# Patient Record
Sex: Female | Born: 1955 | Race: White | Hispanic: No | Smoking: Former smoker
Health system: Southern US, Community
[De-identification: ages and names within clinical notes are randomized; demographics above are authoritative.]

## PROBLEM LIST (undated history)

## (undated) DIAGNOSIS — K219 Gastro-esophageal reflux disease without esophagitis: Secondary | ICD-10-CM

## (undated) DIAGNOSIS — M7138 Other bursal cyst, other site: Secondary | ICD-10-CM

## (undated) DIAGNOSIS — G4700 Insomnia, unspecified: Secondary | ICD-10-CM

## (undated) DIAGNOSIS — A199 Miliary tuberculosis, unspecified: Secondary | ICD-10-CM

## (undated) DIAGNOSIS — F3289 Other specified depressive episodes: Secondary | ICD-10-CM

## (undated) DIAGNOSIS — E7800 Pure hypercholesterolemia, unspecified: Secondary | ICD-10-CM

## (undated) DIAGNOSIS — F419 Anxiety disorder, unspecified: Secondary | ICD-10-CM

## (undated) DIAGNOSIS — F5104 Psychophysiologic insomnia: Secondary | ICD-10-CM

## (undated) DIAGNOSIS — N393 Stress incontinence (female) (male): Secondary | ICD-10-CM

## (undated) DIAGNOSIS — M722 Plantar fascial fibromatosis: Secondary | ICD-10-CM

## (undated) DIAGNOSIS — L752 Apocrine miliaria: Secondary | ICD-10-CM

## (undated) DIAGNOSIS — R4589 Other symptoms and signs involving emotional state: Secondary | ICD-10-CM

## (undated) DIAGNOSIS — M419 Scoliosis, unspecified: Secondary | ICD-10-CM

## (undated) DIAGNOSIS — F329 Major depressive disorder, single episode, unspecified: Secondary | ICD-10-CM

## (undated) HISTORY — DX: Scoliosis, unspecified: M41.9

## (undated) HISTORY — DX: Psychophysiologic insomnia: F51.04

## (undated) HISTORY — DX: Apocrine miliaria: L75.2

## (undated) HISTORY — DX: Anxiety disorder, unspecified: F41.9

## (undated) HISTORY — DX: Other symptoms and signs involving emotional state: R45.89

## (undated) HISTORY — DX: Plantar fascial fibromatosis: M72.2

## (undated) HISTORY — DX: Gastro-esophageal reflux disease without esophagitis: K21.9

## (undated) HISTORY — DX: Stress incontinence (female) (male): N39.3

## (undated) HISTORY — DX: Pure hypercholesterolemia, unspecified: E78.00

## (undated) HISTORY — DX: Other specified depressive episodes: F32.89

## (undated) HISTORY — DX: Miliary tuberculosis, unspecified: A19.9

## (undated) HISTORY — DX: Major depressive disorder, single episode, unspecified: F32.9

## (undated) HISTORY — DX: Insomnia, unspecified: G47.00

## (undated) HISTORY — DX: Other bursal cyst, other site: M71.38

---
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
## 1989-04-18 HISTORY — PX: BIOPSY BREAST: PRO8

## 1997-09-09 ENCOUNTER — Ambulatory Visit (HOSPITAL_COMMUNITY): Admission: RE | Admit: 1997-09-09 | Discharge: 1997-09-09 | Payer: Self-pay | Admitting: *Deleted

## 1998-03-05 ENCOUNTER — Other Ambulatory Visit: Admission: RE | Admit: 1998-03-05 | Discharge: 1998-03-05 | Payer: Self-pay | Admitting: *Deleted

## 1998-08-20 ENCOUNTER — Encounter: Payer: Self-pay | Admitting: *Deleted

## 1998-08-20 ENCOUNTER — Ambulatory Visit (HOSPITAL_COMMUNITY): Admission: RE | Admit: 1998-08-20 | Discharge: 1998-08-20 | Payer: Self-pay | Admitting: *Deleted

## 1999-04-19 DIAGNOSIS — A199 Miliary tuberculosis, unspecified: Secondary | ICD-10-CM

## 1999-04-19 HISTORY — DX: Miliary tuberculosis, unspecified: A19.9

## 2000-02-01 ENCOUNTER — Other Ambulatory Visit: Admission: RE | Admit: 2000-02-01 | Discharge: 2000-02-01 | Payer: Self-pay | Admitting: *Deleted

## 2002-05-15 ENCOUNTER — Other Ambulatory Visit: Admission: RE | Admit: 2002-05-15 | Discharge: 2002-05-15 | Payer: Self-pay | Admitting: Obstetrics & Gynecology

## 2003-05-21 ENCOUNTER — Other Ambulatory Visit: Admission: RE | Admit: 2003-05-21 | Discharge: 2003-05-21 | Payer: Self-pay | Admitting: Obstetrics & Gynecology

## 2004-06-21 ENCOUNTER — Other Ambulatory Visit: Admission: RE | Admit: 2004-06-21 | Discharge: 2004-06-21 | Payer: Self-pay | Admitting: Obstetrics & Gynecology

## 2007-04-25 ENCOUNTER — Encounter: Admission: RE | Admit: 2007-04-25 | Discharge: 2007-07-24 | Payer: Self-pay | Admitting: Specialist

## 2007-07-31 ENCOUNTER — Encounter: Admission: RE | Admit: 2007-07-31 | Discharge: 2007-09-18 | Payer: Self-pay | Admitting: *Deleted

## 2008-01-17 ENCOUNTER — Encounter (INDEPENDENT_AMBULATORY_CARE_PROVIDER_SITE_OTHER): Payer: Self-pay | Admitting: Specialist

## 2008-01-17 ENCOUNTER — Inpatient Hospital Stay (HOSPITAL_COMMUNITY): Admission: RE | Admit: 2008-01-17 | Discharge: 2008-01-18 | Payer: Self-pay | Admitting: Specialist

## 2008-01-17 HISTORY — PX: BACK SURGERY: SHX140

## 2010-08-31 NOTE — Op Note (Signed)
NAME:  Kelly Hopkins, Kelly Hopkins NO.:  [id number]   MEDICAL RECORD NO.:  [id number]          PATIENT TYPE:  OIB   LOCATION:  1535                         FACILITY:  [facility]   PHYSICIAN:  Jene Every, M.D.    DATE OF BIRTH:  [birth year]   DATE OF PROCEDURE:  01/17/2008  DATE OF DISCHARGE:                               OPERATIVE REPORT   PREOPERATIVE DIAGNOSIS:  Spinal stenosis, synovial cyst, lateral recess  stenosis L5-S1.   POSTOPERATIVE DIAGNOSIS:  Spinal stenosis, synovial cyst, lateral recess  stenosis L5-S1.   PROCEDURE:  1. Lateral recess decompression L5-S1.  2. Excision of synovial cyst, hypertrophic facet foraminotomy of S1.   ANESTHESIA:  General.   SURGEON:  Jene Every, M.D.   ASSISTANT:  Roma Schanz, P.A.   INDICATIONS:  A 55 year old with refractory right buttock and thigh pain  and severe facet arthropathy at 4-5 and at 5-1 and a transitional  segment below that.  She had a synovial cyst off to the right impinging  upon the S1 nerve root.  Temporary relief from a selective block.  The  back pain though was we felt not to the point where we would feel a two  level fusion would be appropriate.  The significant majority of her pain  was in her buttock and leg with positive straight leg raise and had some  EHL weakness.  With the temporary relief of the mass effect by the cyst  decompression at L5-S1 was indicated.  Risks and benefits discussed  including bleeding, infection, damage to neurovascular structures, CSF  leakage, epidural fibrosis, adjacent disease, need for fusion in the  future, persistent back pain, anesthetic complications, etc.   PROCEDURE IN DETAIL:  With the patient in supine position after  induction of adequate general anesthesia 1 gram of Kefzol was placed,  prone on the [place] frame.  All bony points well-padded.  Lumbar region  prepped and draped in the usual sterile fashion.  The patient was noted  to be swayback.  Gauge  22 spinal needle was utilized to localize the 5-1  interspace confirmed with x-ray.  The patient had moderate obesity.  Incision was made through the skin, subcutaneous tissue was dissected.  Electrocautery utilized to achieve hemostasis.  Dorsolumbar fascia  identified __________ skin incision and paraspinous muscle elevated from  the lamina of 5 and 1.  McCullough retractor was placed.  Penfield 4 in  the interlaminar space confirmed by x-ray at 5-1.  A very small  interlaminar space was noted with a hypertrophic facet with small  osteophyte cysts emanating posteriorly.  This was excised.  A  hemilaminotomy at the caudad edge of 5 and cephalad edge of S1 was  performed with 2 mm Kerrison.  A hypertrophic ligamentum flavum was  noted and was excised as well.  Neural elements well protected.  A mass  which appeared to be multifactorial bony cyst with ligamentum flavum was  noted.  Cystic structure was excised with a 2 mm Kerrison.  We  decompressed from the medial border of the pedicle and the S1  foraminotomy was performed.  The disk  was without evidence of  herniation.  Undercut the 5 lamina as well with a confirmatory  radiograph with a hockey stick up into the foramen of 4 and cephalad was  confirmed by x-ray.  Following decompression she had good excursion of  the hockey stick cephalad and caudad with the foramen of 5 and S1  beneath the thecal sac.  There was good restoration of the nerve root.  No residual compression, no CSF leakage or active bleeding.  The wound  was copiously irrigated with antibiotic irrigation.  Bone wax placed on  the cancellous surface.  Again, full inspection revealed no further  compression upon the sac or the root.  McCullough retractor was removed.  Paraspinous muscles inspected.  No evidence of active bleeding, was  irrigated.  Dorsolumbar fascia reapproximated with #1 Vicryl interrupted  figure-of-eight sutures.  Subcutaneous tissue reapproximated with  2-0  Vicryl simple sutures.  Skin was reapproximated with 4-0 subcuticular  Prolene.  Wound reinforced with Steri-Strips.  Sterile dressing applied.  The patient was placed supine on the hospital bed, extubated without  difficulty and transported to the recovery room in satisfactory  condition.  The patient tolerated the procedure well with no  complications.      Jene Every, M.D.  Electronically Signed     JB/MEDQ  D:  01/17/2008  T:  01/18/2008  Job:  [id number]

## 2011-01-17 LAB — COMPREHENSIVE METABOLIC PANEL
BUN: 8
Calcium: 9.8
GFR calc non Af Amer: >60
Glucose, Bld: 88
Total Protein: 7.3

## 2011-01-17 LAB — URINALYSIS, ROUTINE W REFLEX MICROSCOPIC
Hgb urine dipstick: NEGATIVE
Ketones, ur: NEGATIVE
Protein, ur: NEGATIVE
Urobilinogen, UA: 0.2

## 2011-01-17 LAB — PROTIME-INR
INR: 1
Prothrombin Time: 12.8

## 2011-01-17 LAB — CBC
HCT: 42.8
Hemoglobin: 14.2
MCHC: 33.3
MCV: 93.6
RDW: 13.5

## 2011-01-17 LAB — PREGNANCY, URINE: Preg Test, Ur: NEGATIVE

## 2012-08-09 ENCOUNTER — Other Ambulatory Visit: Payer: Self-pay

## 2012-08-09 DIAGNOSIS — N393 Stress incontinence (female) (male): Secondary | ICD-10-CM | POA: Insufficient documentation

## 2012-08-09 DIAGNOSIS — F4321 Adjustment disorder with depressed mood: Secondary | ICD-10-CM | POA: Insufficient documentation

## 2012-08-09 DIAGNOSIS — F5105 Insomnia due to other mental disorder: Secondary | ICD-10-CM

## 2012-08-09 DIAGNOSIS — F4323 Adjustment disorder with mixed anxiety and depressed mood: Secondary | ICD-10-CM | POA: Insufficient documentation

## 2012-08-09 DIAGNOSIS — F3289 Other specified depressive episodes: Secondary | ICD-10-CM | POA: Insufficient documentation

## 2012-08-09 DIAGNOSIS — M4120 Other idiopathic scoliosis, site unspecified: Secondary | ICD-10-CM | POA: Insufficient documentation

## 2012-08-09 DIAGNOSIS — K219 Gastro-esophageal reflux disease without esophagitis: Secondary | ICD-10-CM | POA: Insufficient documentation

## 2012-08-09 DIAGNOSIS — M722 Plantar fascial fibromatosis: Secondary | ICD-10-CM | POA: Insufficient documentation

## 2012-10-23 ENCOUNTER — Other Ambulatory Visit: Payer: Self-pay | Admitting: Obstetrics & Gynecology

## 2017-05-08 DIAGNOSIS — Z1231 Encounter for screening mammogram for malignant neoplasm of breast: Secondary | ICD-10-CM | POA: Diagnosis not present

## 2017-05-25 ENCOUNTER — Telehealth: Payer: Self-pay | Admitting: Neurology

## 2017-05-25 ENCOUNTER — Encounter: Payer: Self-pay | Admitting: Neurology

## 2017-05-25 ENCOUNTER — Ambulatory Visit: Payer: BLUE CROSS/BLUE SHIELD | Admitting: Neurology

## 2017-05-25 VITALS — BP 173/113 | HR 86 | Ht 61.5 in | Wt 174.0 lb

## 2017-05-25 DIAGNOSIS — G250 Essential tremor: Secondary | ICD-10-CM | POA: Diagnosis not present

## 2017-05-25 DIAGNOSIS — Z820 Family history of epilepsy and other diseases of the nervous system: Secondary | ICD-10-CM | POA: Diagnosis not present

## 2017-05-25 NOTE — Telephone Encounter (Signed)
I had explained to the patient that if we cannot get an approval for her MRI we will monitor her clinically for now. She can follow-up in 6 months, and we can revisit the need for an MRI. Please call for a follow-up appointment which we did not schedule yet.

## 2017-05-25 NOTE — Patient Instructions (Addendum)
Your head tremor is rather mild.   There is no other abnormality, in particular, no signs of parkinsonism.   We will do a brain scan, called MRI and call you with the test results. We will have to schedule you for this on a separate date. This test requires authorization from your insurance, and we will take care of the insurance process.   Please remember, that any kind of tremor may be exacerbated by anxiety, anger, nervousness, excitement, dehydration, sleep deprivation, by caffeine, and low blood sugar values or blood sugar fluctuations. Some medications, especially some antidepressants and lithium can cause or exacerbate tremors. Tremors may temporarily calm down or subside with the use of a benzodiazepine such as Valium or related medications and with alcohol. Be aware, however, that drinking alcohol is not an approved or appropriate treatment for tremor control and long-term use of benzodiazepines such as Valium, lorazepam, alprazolam, or clonazepam can cause habit formation, physical and psychological addiction. There are very few medications that symptomatically help with tremor reduction, none are without potential side effects.   Please try to exercise regularly, consider seeing Dr. Shelle IronBeane again for your back pain.   I can see you back as needed, so long as her brain MRI shows age-appropriate findings.
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
## 2017-05-25 NOTE — Telephone Encounter (Signed)
I was unable to get the MRI approved on my level. I called [employer] and spoke to one of their nurse's and she was unable to get it approved on her level. The phone number for the peer to peer is [phone]. The member ID is [id number] & DOB is 1955/08/04. The case does close on Monday 05/29/17.

## 2017-05-26 NOTE — Telephone Encounter (Signed)
I called pt, I advised her that her insurance will not approve the MRI. Dr. Frances FurbishAthar recommends monitoring her clinically for now and following up in 6 months. Pt is agreeable to this and an appt was made for 11/21/17 at 8:30am. Pt verbalized understanding of the recommendations and of appt date and time.

## 2017-06-07 DIAGNOSIS — M545 Low back pain: Secondary | ICD-10-CM | POA: Diagnosis not present

## 2017-06-07 DIAGNOSIS — M9903 Segmental and somatic dysfunction of lumbar region: Secondary | ICD-10-CM | POA: Diagnosis not present

## 2017-06-07 DIAGNOSIS — M2560 Stiffness of unspecified joint, not elsewhere classified: Secondary | ICD-10-CM | POA: Diagnosis not present

## 2017-06-07 DIAGNOSIS — R293 Abnormal posture: Secondary | ICD-10-CM | POA: Diagnosis not present

## 2017-06-08 DIAGNOSIS — M79605 Pain in left leg: Secondary | ICD-10-CM | POA: Diagnosis not present

## 2017-06-08 DIAGNOSIS — M79651 Pain in right thigh: Secondary | ICD-10-CM | POA: Diagnosis not present

## 2017-06-08 DIAGNOSIS — M79604 Pain in right leg: Secondary | ICD-10-CM | POA: Diagnosis not present

## 2017-06-08 DIAGNOSIS — M79652 Pain in left thigh: Secondary | ICD-10-CM | POA: Diagnosis not present

## 2017-06-09 DIAGNOSIS — R293 Abnormal posture: Secondary | ICD-10-CM | POA: Diagnosis not present

## 2017-06-09 DIAGNOSIS — M2560 Stiffness of unspecified joint, not elsewhere classified: Secondary | ICD-10-CM | POA: Diagnosis not present

## 2017-06-09 DIAGNOSIS — M545 Low back pain: Secondary | ICD-10-CM | POA: Diagnosis not present

## 2017-06-09 DIAGNOSIS — M9903 Segmental and somatic dysfunction of lumbar region: Secondary | ICD-10-CM | POA: Diagnosis not present

## 2017-06-14 DIAGNOSIS — M545 Low back pain: Secondary | ICD-10-CM | POA: Diagnosis not present

## 2017-06-14 DIAGNOSIS — R293 Abnormal posture: Secondary | ICD-10-CM | POA: Diagnosis not present

## 2017-06-14 DIAGNOSIS — M9903 Segmental and somatic dysfunction of lumbar region: Secondary | ICD-10-CM | POA: Diagnosis not present

## 2017-06-14 DIAGNOSIS — M2560 Stiffness of unspecified joint, not elsewhere classified: Secondary | ICD-10-CM | POA: Diagnosis not present

## 2017-06-19 DIAGNOSIS — M545 Low back pain: Secondary | ICD-10-CM | POA: Diagnosis not present

## 2017-06-19 DIAGNOSIS — M2560 Stiffness of unspecified joint, not elsewhere classified: Secondary | ICD-10-CM | POA: Diagnosis not present

## 2017-06-19 DIAGNOSIS — R293 Abnormal posture: Secondary | ICD-10-CM | POA: Diagnosis not present

## 2017-06-19 DIAGNOSIS — M9903 Segmental and somatic dysfunction of lumbar region: Secondary | ICD-10-CM | POA: Diagnosis not present

## 2017-06-21 DIAGNOSIS — M2560 Stiffness of unspecified joint, not elsewhere classified: Secondary | ICD-10-CM | POA: Diagnosis not present

## 2017-06-21 DIAGNOSIS — M545 Low back pain: Secondary | ICD-10-CM | POA: Diagnosis not present

## 2017-06-21 DIAGNOSIS — R293 Abnormal posture: Secondary | ICD-10-CM | POA: Diagnosis not present

## 2017-06-21 DIAGNOSIS — M9903 Segmental and somatic dysfunction of lumbar region: Secondary | ICD-10-CM | POA: Diagnosis not present

## 2017-06-23 DIAGNOSIS — R293 Abnormal posture: Secondary | ICD-10-CM | POA: Diagnosis not present

## 2017-06-23 DIAGNOSIS — M9903 Segmental and somatic dysfunction of lumbar region: Secondary | ICD-10-CM | POA: Diagnosis not present

## 2017-06-23 DIAGNOSIS — M545 Low back pain: Secondary | ICD-10-CM | POA: Diagnosis not present

## 2017-06-23 DIAGNOSIS — M2560 Stiffness of unspecified joint, not elsewhere classified: Secondary | ICD-10-CM | POA: Diagnosis not present

## 2017-06-27 DIAGNOSIS — M545 Low back pain: Secondary | ICD-10-CM | POA: Diagnosis not present

## 2017-06-27 DIAGNOSIS — M9903 Segmental and somatic dysfunction of lumbar region: Secondary | ICD-10-CM | POA: Diagnosis not present

## 2017-06-27 DIAGNOSIS — M2560 Stiffness of unspecified joint, not elsewhere classified: Secondary | ICD-10-CM | POA: Diagnosis not present

## 2017-06-27 DIAGNOSIS — R293 Abnormal posture: Secondary | ICD-10-CM | POA: Diagnosis not present

## 2017-06-28 DIAGNOSIS — R293 Abnormal posture: Secondary | ICD-10-CM | POA: Diagnosis not present

## 2017-06-28 DIAGNOSIS — M9903 Segmental and somatic dysfunction of lumbar region: Secondary | ICD-10-CM | POA: Diagnosis not present

## 2017-06-28 DIAGNOSIS — M545 Low back pain: Secondary | ICD-10-CM | POA: Diagnosis not present

## 2017-06-28 DIAGNOSIS — M2560 Stiffness of unspecified joint, not elsewhere classified: Secondary | ICD-10-CM | POA: Diagnosis not present

## 2017-06-30 DIAGNOSIS — M2560 Stiffness of unspecified joint, not elsewhere classified: Secondary | ICD-10-CM | POA: Diagnosis not present

## 2017-06-30 DIAGNOSIS — M545 Low back pain: Secondary | ICD-10-CM | POA: Diagnosis not present

## 2017-06-30 DIAGNOSIS — M9903 Segmental and somatic dysfunction of lumbar region: Secondary | ICD-10-CM | POA: Diagnosis not present

## 2017-06-30 DIAGNOSIS — R293 Abnormal posture: Secondary | ICD-10-CM | POA: Diagnosis not present

## 2017-07-03 DIAGNOSIS — R293 Abnormal posture: Secondary | ICD-10-CM | POA: Diagnosis not present

## 2017-07-03 DIAGNOSIS — M2560 Stiffness of unspecified joint, not elsewhere classified: Secondary | ICD-10-CM | POA: Diagnosis not present

## 2017-07-03 DIAGNOSIS — M9903 Segmental and somatic dysfunction of lumbar region: Secondary | ICD-10-CM | POA: Diagnosis not present

## 2017-07-03 DIAGNOSIS — M545 Low back pain: Secondary | ICD-10-CM | POA: Diagnosis not present

## 2017-07-05 DIAGNOSIS — M2560 Stiffness of unspecified joint, not elsewhere classified: Secondary | ICD-10-CM | POA: Diagnosis not present

## 2017-07-05 DIAGNOSIS — M9903 Segmental and somatic dysfunction of lumbar region: Secondary | ICD-10-CM | POA: Diagnosis not present

## 2017-07-05 DIAGNOSIS — M545 Low back pain: Secondary | ICD-10-CM | POA: Diagnosis not present

## 2017-07-05 DIAGNOSIS — R293 Abnormal posture: Secondary | ICD-10-CM | POA: Diagnosis not present

## 2017-07-07 DIAGNOSIS — M9903 Segmental and somatic dysfunction of lumbar region: Secondary | ICD-10-CM | POA: Diagnosis not present

## 2017-07-07 DIAGNOSIS — M545 Low back pain: Secondary | ICD-10-CM | POA: Diagnosis not present

## 2017-07-07 DIAGNOSIS — R293 Abnormal posture: Secondary | ICD-10-CM | POA: Diagnosis not present

## 2017-07-07 DIAGNOSIS — M2560 Stiffness of unspecified joint, not elsewhere classified: Secondary | ICD-10-CM | POA: Diagnosis not present

## 2017-07-10 DIAGNOSIS — M9903 Segmental and somatic dysfunction of lumbar region: Secondary | ICD-10-CM | POA: Diagnosis not present

## 2017-07-10 DIAGNOSIS — M2560 Stiffness of unspecified joint, not elsewhere classified: Secondary | ICD-10-CM | POA: Diagnosis not present

## 2017-07-10 DIAGNOSIS — M545 Low back pain: Secondary | ICD-10-CM | POA: Diagnosis not present

## 2017-07-10 DIAGNOSIS — R293 Abnormal posture: Secondary | ICD-10-CM | POA: Diagnosis not present

## 2017-08-08 DIAGNOSIS — H10021 Other mucopurulent conjunctivitis, right eye: Secondary | ICD-10-CM | POA: Diagnosis not present

## 2017-08-08 DIAGNOSIS — J209 Acute bronchitis, unspecified: Secondary | ICD-10-CM | POA: Diagnosis not present

## 2017-08-19 DIAGNOSIS — J208 Acute bronchitis due to other specified organisms: Secondary | ICD-10-CM | POA: Diagnosis not present

## 2017-08-19 DIAGNOSIS — R062 Wheezing: Secondary | ICD-10-CM | POA: Diagnosis not present

## 2017-08-19 DIAGNOSIS — H109 Unspecified conjunctivitis: Secondary | ICD-10-CM | POA: Diagnosis not present

## 2017-08-19 DIAGNOSIS — R05 Cough: Secondary | ICD-10-CM | POA: Diagnosis not present

## 2017-09-12 DIAGNOSIS — M79605 Pain in left leg: Secondary | ICD-10-CM | POA: Diagnosis not present

## 2017-09-12 DIAGNOSIS — M79604 Pain in right leg: Secondary | ICD-10-CM | POA: Diagnosis not present

## 2017-10-04 DIAGNOSIS — H2513 Age-related nuclear cataract, bilateral: Secondary | ICD-10-CM | POA: Diagnosis not present

## 2017-10-05 DIAGNOSIS — M25512 Pain in left shoulder: Secondary | ICD-10-CM | POA: Diagnosis not present

## 2017-10-05 DIAGNOSIS — M7541 Impingement syndrome of right shoulder: Secondary | ICD-10-CM | POA: Diagnosis not present

## 2017-10-05 DIAGNOSIS — M75112 Incomplete rotator cuff tear or rupture of left shoulder, not specified as traumatic: Secondary | ICD-10-CM | POA: Diagnosis not present

## 2017-10-05 DIAGNOSIS — M25511 Pain in right shoulder: Secondary | ICD-10-CM | POA: Diagnosis not present

## 2017-10-12 DIAGNOSIS — M25512 Pain in left shoulder: Secondary | ICD-10-CM | POA: Diagnosis not present

## 2017-10-18 DIAGNOSIS — M75112 Incomplete rotator cuff tear or rupture of left shoulder, not specified as traumatic: Secondary | ICD-10-CM | POA: Diagnosis not present

## 2017-10-18 DIAGNOSIS — M7541 Impingement syndrome of right shoulder: Secondary | ICD-10-CM | POA: Diagnosis not present

## 2017-10-18 DIAGNOSIS — M25512 Pain in left shoulder: Secondary | ICD-10-CM | POA: Diagnosis not present

## 2017-11-07 DIAGNOSIS — M25512 Pain in left shoulder: Secondary | ICD-10-CM | POA: Diagnosis not present

## 2017-11-09 DIAGNOSIS — M25512 Pain in left shoulder: Secondary | ICD-10-CM | POA: Diagnosis not present

## 2017-11-21 ENCOUNTER — Encounter: Payer: Self-pay | Admitting: Neurology

## 2017-11-21 ENCOUNTER — Ambulatory Visit: Payer: BLUE CROSS/BLUE SHIELD | Admitting: Neurology

## 2017-11-21 VITALS — BP 140/94 | HR 88 | Ht 61.5 in | Wt 166.5 lb

## 2017-11-21 DIAGNOSIS — G250 Essential tremor: Secondary | ICD-10-CM | POA: Diagnosis not present

## 2017-11-21 DIAGNOSIS — R51 Headache: Secondary | ICD-10-CM | POA: Diagnosis not present

## 2017-11-21 DIAGNOSIS — H538 Other visual disturbances: Secondary | ICD-10-CM | POA: Diagnosis not present

## 2017-11-21 DIAGNOSIS — R519 Headache, unspecified: Secondary | ICD-10-CM

## 2017-11-21 NOTE — Patient Instructions (Addendum)
Your exam is stable, thankfully.  I will request a brain MRI again. We will do a blood test today for TSH and CMP.  So long as your MRI is age-appropriate, I can see you back as needed.  Please make sure you discuss with your eye doctor your blurry vision.
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
## 2017-11-22 ENCOUNTER — Telehealth: Payer: Self-pay | Admitting: Neurology

## 2017-11-22 LAB — COMPREHENSIVE METABOLIC PANEL
A/G RATIO: 1.8 (ref 1.2–2.2)
ALK PHOS: 68 IU/L (ref 39–117)
ALT: 24 IU/L (ref 0–32)
AST: 20 IU/L (ref 0–40)
Albumin: 4.5 g/dL (ref 3.6–4.8)
BUN/Creatinine Ratio: 24 (ref 12–28)
BUN: 17 mg/dL (ref 8–27)
Bilirubin Total: 0.5 mg/dL (ref 0.0–1.2)
CALCIUM: 9.9 mg/dL (ref 8.7–10.3)
CO2: 25 mmol/L (ref 20–29)
Chloride: 103 mmol/L (ref 96–106)
Creatinine, Ser: 0.71 mg/dL (ref 0.57–1.00)
GFR calc Af Amer: 106 mL/min/{1.73_m2} (ref >59)
GFR, EST NON AFRICAN AMERICAN: 92 mL/min/{1.73_m2} (ref >59)
GLOBULIN, TOTAL: 2.5 g/dL (ref 1.5–4.5)
Glucose: 87 mg/dL (ref 65–99)
Potassium: 4.9 mmol/L (ref 3.5–5.2)
SODIUM: 143 mmol/L (ref 134–144)
Total Protein: 7 g/dL (ref 6.0–8.5)

## 2017-11-22 LAB — TSH: TSH: 1.89 u[IU]/mL (ref 0.450–4.500)

## 2017-11-22 NOTE — Telephone Encounter (Signed)
MR Brain w/wo contrast Dr. Alinda SierrasAthar [employer] Auth: [id number] (exp. 11/21/17 to 12/20/17). [facility]

## 2017-11-23 DIAGNOSIS — M25512 Pain in left shoulder: Secondary | ICD-10-CM | POA: Diagnosis not present

## 2017-11-27 DIAGNOSIS — M25512 Pain in left shoulder: Secondary | ICD-10-CM | POA: Diagnosis not present

## 2017-11-28 ENCOUNTER — Ambulatory Visit (INDEPENDENT_AMBULATORY_CARE_PROVIDER_SITE_OTHER): Payer: BLUE CROSS/BLUE SHIELD

## 2017-11-28 DIAGNOSIS — H538 Other visual disturbances: Secondary | ICD-10-CM

## 2017-11-28 DIAGNOSIS — R51 Headache: Secondary | ICD-10-CM

## 2017-11-28 DIAGNOSIS — R519 Headache, unspecified: Secondary | ICD-10-CM

## 2017-11-28 DIAGNOSIS — G250 Essential tremor: Secondary | ICD-10-CM

## 2017-11-28 MED ORDER — GADOPENTETATE DIMEGLUMINE 469.01 MG/ML IV SOLN
15.0000 mL | Freq: Once | INTRAVENOUS | Status: AC | PRN
  Administered 2017-11-28: 15 mL via INTRAVENOUS

## 2017-11-29 DIAGNOSIS — M25512 Pain in left shoulder: Secondary | ICD-10-CM | POA: Diagnosis not present

## 2017-12-01 ENCOUNTER — Telehealth: Payer: Self-pay

## 2017-12-01 NOTE — Telephone Encounter (Signed)
I spoke with Dr. Frances FurbishAthar. Pt's TSH and CMP were also normal.  I called pt, and explained pt's MRI and lab results. Pt is asking for a copy of her recent labs and MRI to be mailed to pt. I verified that her address is correct. Will ask our medical records dept to handle this. Pt verbalized understanding of results. Pt had no questions at this time but was encouraged to call back if questions arise.

## 2017-12-01 NOTE — Telephone Encounter (Signed)
-----   Message from [name], MD sent at 12/01/2017 11:10 AM EDT ----- Brain MRI w/wo contrast showed normal for age findings. Please advise pt.  Kelly Harveysa

## 2017-12-01 NOTE — Progress Notes (Signed)
Brain MRI w/wo contrast showed normal for age findings. Please advise pt.  Kelly Hopkins

## 2017-12-04 NOTE — Telephone Encounter (Signed)
I mailed pt recent labs and MRI report on 12/04/17 to pt home address.

## 2017-12-05 DIAGNOSIS — M25512 Pain in left shoulder: Secondary | ICD-10-CM | POA: Diagnosis not present

## 2017-12-12 DIAGNOSIS — M25512 Pain in left shoulder: Secondary | ICD-10-CM | POA: Diagnosis not present

## 2017-12-14 DIAGNOSIS — M25512 Pain in left shoulder: Secondary | ICD-10-CM | POA: Diagnosis not present

## 2017-12-19 DIAGNOSIS — M25512 Pain in left shoulder: Secondary | ICD-10-CM | POA: Diagnosis not present

## 2017-12-19 DIAGNOSIS — M7541 Impingement syndrome of right shoulder: Secondary | ICD-10-CM | POA: Diagnosis not present

## 2017-12-19 DIAGNOSIS — M75112 Incomplete rotator cuff tear or rupture of left shoulder, not specified as traumatic: Secondary | ICD-10-CM | POA: Diagnosis not present

## 2017-12-21 DIAGNOSIS — M25512 Pain in left shoulder: Secondary | ICD-10-CM | POA: Diagnosis not present

## 2018-01-26 DIAGNOSIS — Z23 Encounter for immunization: Secondary | ICD-10-CM | POA: Diagnosis not present

## 2018-04-09 DIAGNOSIS — Z1211 Encounter for screening for malignant neoplasm of colon: Secondary | ICD-10-CM | POA: Diagnosis not present

## 2018-04-09 DIAGNOSIS — Z01419 Encounter for gynecological examination (general) (routine) without abnormal findings: Secondary | ICD-10-CM | POA: Diagnosis not present

## 2018-04-09 DIAGNOSIS — Z124 Encounter for screening for malignant neoplasm of cervix: Secondary | ICD-10-CM | POA: Diagnosis not present

## 2018-04-09 DIAGNOSIS — Z6829 Body mass index (BMI) 29.0-29.9, adult: Secondary | ICD-10-CM | POA: Diagnosis not present

## 2018-04-09 DIAGNOSIS — R350 Frequency of micturition: Secondary | ICD-10-CM | POA: Diagnosis not present

## 2018-05-16 DIAGNOSIS — Z1231 Encounter for screening mammogram for malignant neoplasm of breast: Secondary | ICD-10-CM | POA: Diagnosis not present

## 2018-06-04 DIAGNOSIS — H1013 Acute atopic conjunctivitis, bilateral: Secondary | ICD-10-CM | POA: Diagnosis not present

## 2018-07-03 DIAGNOSIS — M7541 Impingement syndrome of right shoulder: Secondary | ICD-10-CM | POA: Diagnosis not present

## 2018-07-03 DIAGNOSIS — M25512 Pain in left shoulder: Secondary | ICD-10-CM | POA: Diagnosis not present

## 2018-07-03 DIAGNOSIS — M75112 Incomplete rotator cuff tear or rupture of left shoulder, not specified as traumatic: Secondary | ICD-10-CM | POA: Diagnosis not present

## 2018-10-08 DIAGNOSIS — H5213 Myopia, bilateral: Secondary | ICD-10-CM | POA: Diagnosis not present

## 2018-10-08 DIAGNOSIS — H10413 Chronic giant papillary conjunctivitis, bilateral: Secondary | ICD-10-CM | POA: Diagnosis not present

## 2018-10-08 DIAGNOSIS — H25043 Posterior subcapsular polar age-related cataract, bilateral: Secondary | ICD-10-CM | POA: Diagnosis not present

## 2018-10-12 DIAGNOSIS — Z20828 Contact with and (suspected) exposure to other viral communicable diseases: Secondary | ICD-10-CM | POA: Diagnosis not present

## 2019-03-04 DIAGNOSIS — R110 Nausea: Secondary | ICD-10-CM | POA: Diagnosis not present

## 2019-03-04 DIAGNOSIS — R1032 Left lower quadrant pain: Secondary | ICD-10-CM | POA: Diagnosis not present

## 2019-03-04 DIAGNOSIS — R109 Unspecified abdominal pain: Secondary | ICD-10-CM | POA: Diagnosis not present

## 2019-03-09 DIAGNOSIS — Z20828 Contact with and (suspected) exposure to other viral communicable diseases: Secondary | ICD-10-CM | POA: Diagnosis not present

## 2019-05-13 DIAGNOSIS — Z6828 Body mass index (BMI) 28.0-28.9, adult: Secondary | ICD-10-CM | POA: Diagnosis not present

## 2019-05-13 DIAGNOSIS — Z0000 Encounter for general adult medical examination without abnormal findings: Secondary | ICD-10-CM | POA: Diagnosis not present

## 2019-05-13 DIAGNOSIS — Z01419 Encounter for gynecological examination (general) (routine) without abnormal findings: Secondary | ICD-10-CM | POA: Diagnosis not present

## 2019-05-20 DIAGNOSIS — Z1211 Encounter for screening for malignant neoplasm of colon: Secondary | ICD-10-CM | POA: Diagnosis not present

## 2019-05-20 DIAGNOSIS — M85852 Other specified disorders of bone density and structure, left thigh: Secondary | ICD-10-CM | POA: Diagnosis not present

## 2019-05-20 DIAGNOSIS — Z1231 Encounter for screening mammogram for malignant neoplasm of breast: Secondary | ICD-10-CM | POA: Diagnosis not present

## 2019-10-10 DIAGNOSIS — H25813 Combined forms of age-related cataract, bilateral: Secondary | ICD-10-CM | POA: Diagnosis not present

## 2019-10-10 DIAGNOSIS — H5213 Myopia, bilateral: Secondary | ICD-10-CM | POA: Diagnosis not present

## 2019-11-14 DIAGNOSIS — H00012 Hordeolum externum right lower eyelid: Secondary | ICD-10-CM | POA: Diagnosis not present

## 2020-05-20 DIAGNOSIS — Z1231 Encounter for screening mammogram for malignant neoplasm of breast: Secondary | ICD-10-CM | POA: Diagnosis not present

## 2020-06-04 DIAGNOSIS — R5383 Other fatigue: Secondary | ICD-10-CM | POA: Diagnosis not present

## 2020-06-04 DIAGNOSIS — E559 Vitamin D deficiency, unspecified: Secondary | ICD-10-CM | POA: Diagnosis not present

## 2020-06-04 DIAGNOSIS — R7303 Prediabetes: Secondary | ICD-10-CM | POA: Diagnosis not present

## 2020-06-04 DIAGNOSIS — E7800 Pure hypercholesterolemia, unspecified: Secondary | ICD-10-CM | POA: Diagnosis not present

## 2020-09-09 DIAGNOSIS — E7800 Pure hypercholesterolemia, unspecified: Secondary | ICD-10-CM | POA: Diagnosis not present

## 2020-10-13 DIAGNOSIS — H25813 Combined forms of age-related cataract, bilateral: Secondary | ICD-10-CM | POA: Diagnosis not present

## 2020-10-13 DIAGNOSIS — H35373 Puckering of macula, bilateral: Secondary | ICD-10-CM | POA: Diagnosis not present

## 2020-10-13 DIAGNOSIS — H5213 Myopia, bilateral: Secondary | ICD-10-CM | POA: Diagnosis not present

## 2020-11-20 DIAGNOSIS — U071 COVID-19: Secondary | ICD-10-CM | POA: Diagnosis not present

## 2020-11-30 ENCOUNTER — Encounter: Payer: Self-pay | Admitting: Internal Medicine

## 2020-11-30 ENCOUNTER — Ambulatory Visit (INDEPENDENT_AMBULATORY_CARE_PROVIDER_SITE_OTHER): Payer: Medicare Other | Admitting: Internal Medicine

## 2020-11-30 ENCOUNTER — Other Ambulatory Visit: Payer: Self-pay

## 2020-11-30 VITALS — BP 143/86 | HR 75 | Ht 61.0 in | Wt 172.6 lb

## 2020-11-30 DIAGNOSIS — R42 Dizziness and giddiness: Secondary | ICD-10-CM

## 2020-11-30 DIAGNOSIS — R011 Cardiac murmur, unspecified: Secondary | ICD-10-CM

## 2020-11-30 DIAGNOSIS — E785 Hyperlipidemia, unspecified: Secondary | ICD-10-CM | POA: Diagnosis not present

## 2020-11-30 NOTE — Progress Notes (Signed)
OFFICE CONSULT NOTE  Chief Complaint:  Dizziness, statin intolerance  Primary Care Physician: Patient, No Pcp Per (Inactive)  HPI:  Kelly Hopkins is a 65 y.o. female who is being seen today for the evaluation of dizziness, statin intolerance at the request of Annamaria Helling, MD. This is a pleasant 65 year old female kindly referred by Dr. Aldona Bar for evaluation and management of dizziness and statin intolerance.  She has been followed by neurology for dizziness but recently has had some issues with imbalance, hearing her heartbeat in her neck, and suffering from some leg cramps.  She has been on and off of statins, in the past has tried Lipitor, Zocor and more recently placed on Crestor for elevated cholesterol.  Her on treatment cholesterol in May was total 176, triglycerides 112, HDL 71 and LDL 85.  Unfortunately she still feels like she is having cramping on this medication which is rosuvastatin 20 mg daily.  Off treatment I would expect her cholesterol to be about 50% higher.  She also takes low-dose aspirin but has few other cardiovascular risk factors including no history of hypertension, diabetes or other risk factors other than age.  She does report her father had had an MI that he died from in his 71s.  She eats a varied diet but has had recent weight gain.  PMHx:  Past Medical History:  Diagnosis Date   Acid reflux    Anxiety    Anxiety, mild    Chronic insomnia    Emotional depression    Female stress incontinence    Fox-Fordyce disease    Hypercholesteremia    Insomnia    Menopausal depression    Plantar fasciitis, left    Scoliosis deformity of spine    Synovial cyst of lumbar facet joint    L5-S1   Tuberculosis disease, miliary 2001   Took meds x 9 mos     FAMHx:  Family History  Problem Relation Age of Onset   Asthma Mother    COPD Mother        Deceased   Diabetes Father    Heart attack Father 83       Deceased   Breast cancer Paternal Aunt     SOCHx:    reports that she quit smoking about 14 years ago. Her smoking use included cigarettes. She has a 36.00 pack-year smoking history. She has never used smokeless tobacco. She reports current alcohol use of about 2.0 standard drinks per week. She reports that she does not use drugs.  ALLERGIES:  No Known Allergies  ROS: Pertinent items noted in HPI and remainder of comprehensive ROS otherwise negative.  HOME MEDS: Current Outpatient Medications on File Prior to Visit  Medication Sig Dispense Refill   Acetaminophen (TYLENOL ARTHRITIS EXT RELIEF PO)      ALPRAZolam (XANAX) 0.25 MG tablet alprazolam 0.25 mg tablet  ONE TABLET AS NEEDED FOR ANXIETY   NOT TO EXCEED 1 /24 HOURS     aspirin EC 81 MG tablet Take 81 mg by mouth daily.     ibuprofen (ADVIL) 600 MG tablet ibuprofen 600 mg tablet  TAKE 1 TABLET BY MOUTH EVERY 6 TO 8 HOURS AS NEEDED FOR PAIN     pantoprazole (PROTONIX) 40 MG tablet Take 40 mg by mouth daily. (Patient not taking: Reported on 11/30/2020)     pantoprazole (PROTONIX) 40 MG tablet pantoprazole 40 mg tablet,delayed release  TAKE 1 TABLET BY MOUTH EVERY DAY IN THE MORNING     rosuvastatin (  CRESTOR) 20 MG tablet rosuvastatin 20 mg tablet  TAKE 1 TABLET BY MOUTH EVERYDAY AT BEDTIME     sertraline (ZOLOFT) 100 MG tablet sertraline 100 mg tablet  TAKE 1 TABLET BY MOUTH EVERY DAY     zolpidem (AMBIEN) 10 MG tablet zolpidem 10 mg tablet  TAKE 1/2 TO 1 TABLET AT BEDTIME FOR SLEEP AS NEEDED     No current facility-administered medications on file prior to visit.    LABS/IMAGING: No results found for this or any previous visit (from the past 48 hour(s)). No results found.  LIPID PANEL: No results found for: CHOL, TRIG, HDL, CHOLHDL, VLDL, LDLCALC, LDLDIRECT  WEIGHTS: Wt Readings from Last 3 Encounters:  11/30/20 172 lb 9.6 oz (78.3 kg)  11/21/17 166 lb 8 oz (75.5 kg)  05/25/17 174 lb (78.9 kg)    VITALS: BP (!) 143/86   Pulse 75   Ht 5\' 1"  (1.549 m)   Wt 172 lb 9.6 oz  (78.3 kg)   SpO2 98%   BMI 32.61 kg/m   EXAM: General appearance: alert and no distress Neck: no carotid bruit, no JVD, and thyroid not enlarged, symmetric, no tenderness/mass/nodules Lungs: clear to auscultation bilaterally Heart: regular rate and rhythm, S1, S2 normal, and diastolic murmur: mid diastolic 2/6, blowing at 2nd right intercostal space Abdomen: soft, non-tender; bowel sounds normal; no masses,  no organomegaly Extremities: extremities normal, atraumatic, no cyanosis or edema Pulses: 2+ and symmetric Skin: Skin color, texture, turgor normal. No rashes or lesions Neurologic: Grossly normal Psych: Pleasant  EKG: Normal sinus rhythm at 75- personally reviewed  ASSESSMENT: Dizziness Statin intolerance-myalgias/cramps Mixed dyslipidemia Murmur  PLAN: 1.   Ms. Grilli has had some dizziness and had been previously diagnosed with an axial tremor.  She notes she can hear her heartbeat sometimes at night.  She also gets some intermittent palpitations.  She has had leg cramping and soreness on statins which seems to have recurred after switching to rosuvastatin recently although her cholesterol is very well controlled.  She is on low-dose aspirin which may not be necessary for primary prevention since she has few risk factors.  I would recommend a coronary calcium score to better risk stratify her.  If it is lower 0 would recommend discontinuing aspirin.  We will get an echo to evaluate her murmur as well.  She is never had 1 but reports having had a murmur for most of her life.  Finally she is having some dizziness.  Is not clear whether this would be related to her tremor.  Could be a combination of causes.  We will plan a 2-week statin holiday to see if her symptoms improve.  Ultimately if there is some evidence of premature ASCVD, we may be able to consider a PCSK9 inhibitor however at this point she would not necessarily qualify just on statin intolerance alone.  Thanks again for  the kind referral.  Daphine Deutscher, MD, [facility]  [place]  [facility]  Medical Director of the [facility]  [facility] Diplomate of the American Board of Clinical Lipidology Attending Cardiologist  Direct Dial: [phone]  Fax: [phone]  Website:  [email or web address] Esme Freund 11/30/2020, 8:44 AM

## 2020-11-30 NOTE — Patient Instructions (Signed)
Medication Instructions:  Please HOLD your cholesterol medication rosuvastatin for 2-3 weeks and then let us know how you are doing off the medication.  *If you need a refill on your cardiac medications before your next appointment, please call your pharmacy*   Testing/Procedures:  Dr. Rennis Golden has requested that you have an echocardiogram. Echocardiography is a painless test that uses sound waves to create images of your heart. It provides your doctor with information about the size and shape of your heart and how well your heart's chambers and valves are working. This procedure takes approximately one hour. There are no restrictions for this procedure. This test is done at [address].   Dr. Rennis Golden has ordered a CT coronary calcium score. This test is done at [address]. This is $99 out of pocket.   Coronary CalciumScan A coronary calcium scan is an imaging test used to look for deposits of calcium and other fatty materials (plaques) in the inner lining of the blood vessels of the heart (coronary arteries). These deposits of calcium and plaques can partly clog and narrow the coronary arteries without producing any symptoms or warning signs. This puts a person at risk for a heart attack. This test can detect these deposits before symptoms develop. Tell a health care provider about: Any allergies you have. All medicines you are taking, including vitamins, herbs, eye drops, creams, and over-the-counter medicines. Any problems you or family members have had with anesthetic medicines. Any blood disorders you have. Any surgeries you have had. Any medical conditions you have. Whether you are pregnant or may be pregnant. What are the risks? Generally, this is a safe procedure. However, problems may occur, including: Harm to a pregnant woman and her unborn baby. This test involves the use of radiation. Radiation exposure can be dangerous to a pregnant woman and  her unborn baby. If you are pregnant, you generally should not have this procedure done. Slight increase in the risk of cancer. This is because of the radiation involved in the test. What happens before the procedure? No preparation is needed for this procedure. What happens during the procedure? You will undress and remove any jewelry around your neck or chest. You will put on a hospital gown. Sticky electrodes will be placed on your chest. The electrodes will be connected to an electrocardiogram (ECG) machine to record a tracing of the electrical activity of your heart. A CT scanner will take pictures of your heart. During this time, you will be asked to lie still and hold your breath for 2-3 seconds while a picture of your heart is being taken. The procedure may vary among health care providers and hospitals. What happens after the procedure? You can get dressed. You can return to your normal activities. It is up to you to get the results of your test. Ask your health care provider, or the department that is doing the test, when your results will be ready. Summary A coronary calcium scan is an imaging test used to look for deposits of calcium and other fatty materials (plaques) in the inner lining of the blood vessels of the heart (coronary arteries). Generally, this is a safe procedure. Tell your health care provider if you are pregnant or may be pregnant. No preparation is needed for this procedure. A CT scanner will take pictures of your heart. You can return to your normal activities after the scan is done. This information is not intended to replace  advice given to you by your health care provider. Make sure you discuss any questions you have with your health care provider. Document Released: 10/01/2007 Document Revised: 02/22/2016 Document Reviewed: 02/22/2016 Elsevier Interactive Patient Education  2017 [employer].    Follow-Up: At [facility], you and your health needs are  our priority.  As part of our continuing mission to provide you with exceptional heart care, we have created designated Provider Care Teams.  These Care Teams include your primary Cardiologist (physician) and Advanced Practice Providers (APPs -  Physician Assistants and Nurse Practitioners) who all work together to provide you with the care you need, when you need it.  We recommend signing up for the patient portal called "MyChart".  Sign up information is provided on this After Visit Summary.  MyChart is used to connect with patients for Virtual Visits (Telemedicine).  Patients are able to view lab/test results, encounter notes, upcoming appointments, etc.  Non-urgent messages can be sent to your provider as well.   To learn more about what you can do with MyChart, go to [email or web address].    Your next appointment:   4-6 week(s)  The format for your next appointment:   In Person  Provider:   You may see Dr. Rennis Golden or one of the following Advanced Practice Providers on your designated Care Team:   Azalee Course, PA-C Micah Flesher, [place] or  Judy Pimple, [place]  OK to double book Dr. Rennis Golden on a DOD day if needed   Other Instructions

## 2020-12-14 ENCOUNTER — Other Ambulatory Visit: Payer: Self-pay

## 2020-12-14 ENCOUNTER — Ambulatory Visit (INDEPENDENT_AMBULATORY_CARE_PROVIDER_SITE_OTHER)
Admission: RE | Admit: 2020-12-14 | Discharge: 2020-12-14 | Disposition: A | Discharge status: Home / Self Care | Payer: Self-pay | Source: Ambulatory Visit | Attending: Internal Medicine | Admitting: Internal Medicine

## 2020-12-14 DIAGNOSIS — E785 Hyperlipidemia, unspecified: Secondary | ICD-10-CM

## 2020-12-17 ENCOUNTER — Other Ambulatory Visit: Payer: Self-pay

## 2020-12-17 ENCOUNTER — Ambulatory Visit (HOSPITAL_COMMUNITY): Payer: Medicare Other | Attending: Cardiology

## 2020-12-17 DIAGNOSIS — R42 Dizziness and giddiness: Secondary | ICD-10-CM | POA: Insufficient documentation

## 2020-12-17 DIAGNOSIS — R011 Cardiac murmur, unspecified: Secondary | ICD-10-CM | POA: Diagnosis not present

## 2020-12-17 LAB — ECHOCARDIOGRAM COMPLETE
Area-P 1/2: 3.17 cm2
S' Lateral: 2.8 cm

## 2020-12-24 ENCOUNTER — Encounter: Payer: Self-pay | Admitting: Internal Medicine

## 2020-12-24 ENCOUNTER — Other Ambulatory Visit: Payer: Self-pay

## 2020-12-24 ENCOUNTER — Ambulatory Visit (HOSPITAL_COMMUNITY)
Admission: RE | Admit: 2020-12-24 | Discharge: 2020-12-24 | Disposition: A | Discharge status: Home / Self Care | Payer: Medicare Other | Source: Ambulatory Visit | Attending: Internal Medicine | Admitting: Internal Medicine

## 2020-12-24 ENCOUNTER — Ambulatory Visit: Payer: Medicare Other | Admitting: Internal Medicine

## 2020-12-24 VITALS — BP 120/90 | HR 86 | Resp 20 | Ht 61.0 in | Wt 170.6 lb

## 2020-12-24 DIAGNOSIS — I700 Atherosclerosis of aorta: Secondary | ICD-10-CM

## 2020-12-24 DIAGNOSIS — R011 Cardiac murmur, unspecified: Secondary | ICD-10-CM

## 2020-12-24 DIAGNOSIS — M79662 Pain in left lower leg: Secondary | ICD-10-CM | POA: Diagnosis not present

## 2020-12-24 DIAGNOSIS — E785 Hyperlipidemia, unspecified: Secondary | ICD-10-CM | POA: Diagnosis not present

## 2020-12-24 MED ORDER — ROSUVASTATIN CALCIUM 10 MG PO TABS: 10.0000 mg | ORAL_TABLET | Freq: Every day | ORAL | 3 refills | Status: DC

## 2020-12-24 NOTE — Progress Notes (Signed)
OFFICE CONSULT NOTE  Chief Complaint:  Follow-up dizziness, statin intolerance  Primary Care Physician: Patient, No Pcp Per (Inactive)  HPI:  Kelly Hopkins is a 65 y.o. female who is being seen today for the evaluation of dizziness, statin intolerance at the request of No ref. provider found. This is a pleasant 65 year old female kindly referred by Dr. Aldona Bar for evaluation and management of dizziness and statin intolerance.  She has been followed by neurology for dizziness but recently has had some issues with imbalance, hearing her heartbeat in her neck, and suffering from some leg cramps.  She has been on and off of statins, in the past has tried Lipitor, Zocor and more recently placed on Crestor for elevated cholesterol.  Her on treatment cholesterol in May was total 176, triglycerides 112, HDL 71 and LDL 85.  Unfortunately she still feels like she is having cramping on this medication which is rosuvastatin 20 mg daily.  Off treatment I would expect her cholesterol to be about 50% higher.  She also takes low-dose aspirin but has few other cardiovascular risk factors including no history of hypertension, diabetes or other risk factors other than age.  She does report her father had had an MI that he died from in his 24s.  She eats a varied diet but has had recent weight gain.  12/24/2020  Kelly Hopkins is seen today in follow-up.  Overall she is doing fairly well.  She has been experiencing some left leg pain particularly tightness and discomfort in her left calf particularly when walking.  She felt like this was a statin side effect however after taking a statin holiday the symptoms did not necessarily improve.  I advised her that is not typical to see a statin side effect focal to one muscle group.  She does have some varicose veins and it could be related to that.  She is also concerned about the possibility of DVT.  She has been on low-dose aspirin however which would lower that risk.  She had a  calcium score which is 0 although there was some aortic atherosclerosis.  I would target her LDL to less than 100.  She had previously been on 20 of rosuvastatin and I would recommend we decrease that to 10 mg daily to try to strike a balance between risk and benefit.  Echo showed some aortic sclerosis with normal systolic function and mild diastolic dysfunction.  Nothing of super concern.   PMHx:  Past Medical History:  Diagnosis Date   Acid reflux    Anxiety    Anxiety, mild    Chronic insomnia    Emotional depression    Female stress incontinence    Fox-Fordyce disease    Hypercholesteremia    Insomnia    Menopausal depression    Plantar fasciitis, left    Scoliosis deformity of spine    Synovial cyst of lumbar facet joint    L5-S1   Tuberculosis disease, miliary 2001   Took meds x 9 mos     FAMHx:  Family History  Problem Relation Age of Onset   Asthma Mother    COPD Mother        Deceased   Diabetes Father    Heart attack Father 43       Deceased   Breast cancer Paternal Aunt     SOCHx:   reports that she quit smoking about 14 years ago. Her smoking use included cigarettes. She has a 36.00 pack-year smoking history. She  has never used smokeless tobacco. She reports current alcohol use of about 2.0 standard drinks per week. She reports that she does not use drugs.  ALLERGIES:  Allergies  Allergen Reactions   Atorvastatin     Myalgias, Cramps   Rosuvastatin     Muscle cramps   Zocor [Simvastatin]     Myalgias, Cramps    ROS: Pertinent items noted in HPI and remainder of comprehensive ROS otherwise negative.  HOME MEDS: Current Outpatient Medications on File Prior to Visit  Medication Sig Dispense Refill   Acetaminophen (TYLENOL ARTHRITIS EXT RELIEF PO)      ALPRAZolam (XANAX) 0.25 MG tablet alprazolam 0.25 mg tablet  ONE TABLET AS NEEDED FOR ANXIETY   NOT TO EXCEED 1 /24 HOURS     pantoprazole (PROTONIX) 40 MG tablet Take 40 mg by mouth daily.      sertraline (ZOLOFT) 100 MG tablet sertraline 100 mg tablet  TAKE 1 TABLET BY MOUTH EVERY DAY     zolpidem (AMBIEN) 10 MG tablet zolpidem 10 mg tablet  TAKE 1/2 TO 1 TABLET AT BEDTIME FOR SLEEP AS NEEDED     ibuprofen (IBU-200) 200 MG tablet Take 200 mg by mouth as needed.     influenza vac split quadrivalent PF (AFLURIA QUADRIVALENT) 0.5 ML injection Afluria Qd 2020-21 (36 mos up)(PF)60 mcg (15 mcg x4)/0.5 mL IM syringe  PHARMACY ADMINISTERED     No current facility-administered medications on file prior to visit.    LABS/IMAGING: No results found for this or any previous visit (from the past 48 hour(s)). No results found.  LIPID PANEL: No results found for: CHOL, TRIG, HDL, CHOLHDL, VLDL, LDLCALC, LDLDIRECT  WEIGHTS: Wt Readings from Last 3 Encounters:  12/24/20 170 lb 9.6 oz (77.4 kg)  11/30/20 172 lb 9.6 oz (78.3 kg)  11/21/17 166 lb 8 oz (75.5 kg)    VITALS: BP 120/90 (BP Location: Left Arm, Patient Position: Sitting, Cuff Size: Normal)   Pulse 86   Resp 20   Ht 5\' 1"  (1.549 m)   Wt 170 lb 9.6 oz (77.4 kg)   SpO2 96%   BMI 32.23 kg/m   EXAM: General appearance: alert and no distress Extremities: edema 1+ LLE, TTP over left calf, no erythema Pulses: 2+ and symmetric Skin: Skin color, texture, turgor normal. 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   [name] did not have significant improvement after statin holiday.  I advise restarting her Crestor but will decrease her dose to 10 mg daily to try to reduce potential side effects.  This should still keep her LDL below 100.  She is describing some left calf pain.  She is concerned about DVT which I think is unlikely based on physical exam however we will go ahead and get venous Dopplers.  If  negative she can discontinue aspirin because she had no coronary calcium either.  Echo showed some mild aortic sclerosis but no evidence of stenosis.  We will plan repeat lipids in about 3 months and I will contact her with those results.  Otherwise follow-up with me annually or sooner as necessary  Daphine Deutscher, MD, [facility], FACP  [place]  [facility]  Medical Director of the Advanced Lipid Disorders &   Cardiovascular Risk Reduction Clinic Diplomate of the American Board  of Clinical Lipidology Attending Cardiologist  Direct Dial: [phone]  Fax: [phone]  Website:  [email or web address] [email or web address] 12/24/2020, 8:44 AM

## 2020-12-24 NOTE — Patient Instructions (Signed)
Medication Instructions:  START crestor 10mg  daily   *If you need a refill on your cardiac medications before your next appointment, please call your pharmacy*   Lab Work: FASTING lab work in about 3 months to check cholesterol   If you have labs (blood work) drawn today and your tests are completely normal, you will receive your results only by: MyChart Message (if you have MyChart) OR A paper copy in the mail If you have any lab test that is abnormal or we need to change your treatment, we will call you to review the results.   Testing/Procedures:    Follow-Up: At [facility], you and your health needs are our priority.  As part of our continuing mission to provide you with exceptional heart care, we have created designated Provider Care Teams.  These Care Teams include your primary Cardiologist (physician) and Advanced Practice Providers (APPs -  Physician Assistants and Nurse Practitioners) who all work together to provide you with the care you need, when you need it.  We recommend signing up for the patient portal called "MyChart".  Sign up information is provided on this After Visit Summary.  MyChart is used to connect with patients for Virtual Visits (Telemedicine).  Patients are able to view lab/test results, encounter notes, upcoming appointments, etc.  Non-urgent messages can be sent to your provider as well.   To learn more about what you can do with MyChart, go to [facility].    Your next appointment:   12 month(s)  The format for your next appointment:   In Person  Provider:   [email or web address] [place], MD   Other Instructions

## 2020-12-25 ENCOUNTER — Other Ambulatory Visit: Payer: Self-pay | Admitting: *Deleted

## 2021-04-06 DIAGNOSIS — E785 Hyperlipidemia, unspecified: Secondary | ICD-10-CM | POA: Diagnosis not present

## 2021-04-07 LAB — LIPID PANEL
Chol/HDL Ratio: 3 ratio (ref 0.0–4.4)
Cholesterol, Total: 202 mg/dL — ABNORMAL HIGH (ref 100–199)
HDL: 67 mg/dL (ref >39)
LDL Chol Calc (NIH): 109 mg/dL — ABNORMAL HIGH (ref 0–99)
Triglycerides: 152 mg/dL — ABNORMAL HIGH (ref 0–149)
VLDL Cholesterol Cal: 26 mg/dL (ref 5–40)

## 2021-04-09 ENCOUNTER — Encounter: Payer: Self-pay | Admitting: Internal Medicine

## 2021-04-09 DIAGNOSIS — E785 Hyperlipidemia, unspecified: Secondary | ICD-10-CM

## 2021-05-26 DIAGNOSIS — Z1231 Encounter for screening mammogram for malignant neoplasm of breast: Secondary | ICD-10-CM | POA: Diagnosis not present

## 2021-06-04 DIAGNOSIS — R928 Other abnormal and inconclusive findings on diagnostic imaging of breast: Secondary | ICD-10-CM | POA: Diagnosis not present

## 2021-06-07 DIAGNOSIS — E7800 Pure hypercholesterolemia, unspecified: Secondary | ICD-10-CM | POA: Diagnosis not present

## 2021-06-07 DIAGNOSIS — Z0000 Encounter for general adult medical examination without abnormal findings: Secondary | ICD-10-CM | POA: Diagnosis not present

## 2021-06-23 ENCOUNTER — Encounter: Payer: Self-pay | Admitting: Internal Medicine

## 2021-06-23 ENCOUNTER — Other Ambulatory Visit: Payer: Self-pay | Admitting: Internal Medicine

## 2021-06-24 MED ORDER — ROSUVASTATIN CALCIUM 10 MG PO TABS: 10.0000 mg | ORAL_TABLET | Freq: Every day | ORAL | 3 refills | Status: DC

## 2021-08-03 DIAGNOSIS — J069 Acute upper respiratory infection, unspecified: Secondary | ICD-10-CM | POA: Diagnosis not present

## 2021-10-14 DIAGNOSIS — H5213 Myopia, bilateral: Secondary | ICD-10-CM | POA: Diagnosis not present

## 2021-10-14 DIAGNOSIS — H2513 Age-related nuclear cataract, bilateral: Secondary | ICD-10-CM | POA: Diagnosis not present

## 2021-10-14 DIAGNOSIS — H04123 Dry eye syndrome of bilateral lacrimal glands: Secondary | ICD-10-CM | POA: Diagnosis not present

## 2022-03-26 ENCOUNTER — Other Ambulatory Visit: Payer: Self-pay | Admitting: Internal Medicine

## 2022-06-01 DIAGNOSIS — Z1231 Encounter for screening mammogram for malignant neoplasm of breast: Secondary | ICD-10-CM | POA: Diagnosis not present

## 2022-07-26 ENCOUNTER — Ambulatory Visit (HOSPITAL_BASED_OUTPATIENT_CLINIC_OR_DEPARTMENT_OTHER): Payer: Medicare Other | Admitting: Internal Medicine

## 2022-07-26 ENCOUNTER — Encounter (HOSPITAL_BASED_OUTPATIENT_CLINIC_OR_DEPARTMENT_OTHER): Payer: Self-pay | Admitting: Internal Medicine

## 2022-07-26 ENCOUNTER — Ambulatory Visit: Payer: Medicare Other | Attending: Internal Medicine

## 2022-07-26 VITALS — BP 128/78 | HR 77 | Ht 61.5 in | Wt 173.4 lb

## 2022-07-26 DIAGNOSIS — E785 Hyperlipidemia, unspecified: Secondary | ICD-10-CM

## 2022-07-26 DIAGNOSIS — I358 Other nonrheumatic aortic valve disorders: Secondary | ICD-10-CM | POA: Diagnosis not present

## 2022-07-26 DIAGNOSIS — R002 Palpitations: Secondary | ICD-10-CM

## 2022-07-26 DIAGNOSIS — I700 Atherosclerosis of aorta: Secondary | ICD-10-CM

## 2022-07-26 NOTE — Progress Notes (Unsigned)
Enrolled patient for a 14 day Zio XT  monitor to be mailed to patients home  °

## 2022-07-26 NOTE — Progress Notes (Addendum)
OFFICE CONSULT NOTE  Chief Complaint:  Follow-up  Primary Care Physician: Sigmund Hazel, MD  HPI:  Kelly Hopkins is a 67 y.o. female who is being seen today for the evaluation of dizziness, statin intolerance at the request of No ref. provider found. This is a pleasant 67 year old female kindly referred by Dr. Aldona Bar for evaluation and management of dizziness and statin intolerance.  She has been followed by neurology for dizziness but recently has had some issues with imbalance, hearing her heartbeat in her neck, and suffering from some leg cramps.  She has been on and off of statins, in the past has tried Lipitor, Zocor and more recently placed on Crestor for elevated cholesterol.  Her on treatment cholesterol in May was total 176, triglycerides 112, HDL 71 and LDL 85.  Unfortunately she still feels like she is having cramping on this medication which is rosuvastatin 20 mg daily.  Off treatment I would expect her cholesterol to be about 50% higher.  She also takes low-dose aspirin but has few other cardiovascular risk factors including no history of hypertension, diabetes or other risk factors other than age.  She does report her father had had an MI that he died from in his 24s.  She eats a varied diet but has had recent weight gain.  12/24/2020  Kelly Hopkins is seen today in follow-up.  Overall she is doing fairly well.  She has been experiencing some left leg pain particularly tightness and discomfort in her left calf particularly when walking.  She felt like this was a statin side effect however after taking a statin holiday the symptoms did not necessarily improve.  I advised her that is not typical to see a statin side effect focal to one muscle group.  She does have some varicose veins and it could be related to that.  She is also concerned about the possibility of DVT.  She has been on low-dose aspirin however which would lower that risk.  She had a calcium score which is 0 although there  was some aortic atherosclerosis.  I would target her LDL to less than 100.  She had previously been on 20 of rosuvastatin and I would recommend we decrease that to 10 mg daily to try to strike a balance between risk and benefit.  Echo showed some aortic sclerosis with normal systolic function and mild diastolic dysfunction.  Nothing of super concern.  07/26/2022  Kelly Hopkins returns today for follow-up.  Recently she has been having some palpitations.  She has noted it mostly at rest.  They are paroxysmal.  Nothing seems to provoke it or improve it.  She is also noted some elevated heart rate and also some slower than typical heart rates (in the 60s) on her Fitbit.  She denies any chest pain.  She had a negative calcium score in the past and an echo which showed some mild diastolic dysfunction, normal LVEF and aortic valve sclerosis.  Her cholesterol was assessed last year when LDL was at 99 which was at target.  She is tolerating 10 mg rosuvastatin daily without issues.  PMHx:  Past Medical History:  Diagnosis Date   Acid reflux    Anxiety    Anxiety, mild    Chronic insomnia    Emotional depression    Female stress incontinence    Fox-Fordyce disease    Hypercholesteremia    Insomnia    Menopausal depression    Plantar fasciitis, left    Scoliosis  deformity of spine    Synovial cyst of lumbar facet joint    L5-S1   Tuberculosis disease, miliary 2001   Took meds x 9 mos     FAMHx:  Family History  Problem Relation Age of Onset   Asthma Mother    COPD Mother        Deceased   Diabetes Father    Heart attack Father [id number]       Deceased   Breast cancer Paternal Aunt     SOCHx:   reports that she quit smoking about 15 years ago. Her smoking use included cigarettes. She has a 36.00 pack-year smoking history. She has never used smokeless tobacco. She reports current alcohol use of about 2.0 standard drinks of alcohol per week. She reports that she does not use drugs.  ALLERGIES:   Allergies  Allergen Reactions   Atorvastatin     Myalgias, Cramps   Rosuvastatin     Muscle cramps   Zocor [Simvastatin]     Myalgias, Cramps    ROS: Pertinent items noted in HPI and remainder of comprehensive ROS otherwise negative.  HOME MEDS: Current Outpatient Medications on File Prior to Visit  Medication Sig Dispense Refill   Acetaminophen (TYLENOL ARTHRITIS EXT RELIEF PO)      ALPRAZolam (XANAX) 0.25 MG tablet alprazolam 0.25 mg tablet  ONE TABLET AS NEEDED FOR ANXIETY   NOT TO EXCEED 1 /24 HOURS     ibuprofen (IBU-200) 200 MG tablet Take 200 mg by mouth as needed.     influenza vac split quadrivalent PF (AFLURIA QUADRIVALENT) 0.5 ML injection Afluria Qd 2020-21 (36 mos up)(PF)60 mcg (15 mcg x4)/0.5 mL IM syringe  PHARMACY ADMINISTERED     pantoprazole (PROTONIX) 40 MG tablet Take 40 mg by mouth daily.     rosuvastatin (CRESTOR) 10 MG tablet TAKE 1 TABLET BY MOUTH EVERY DAY 90 tablet 0   sertraline (ZOLOFT) 100 MG tablet sertraline 100 mg tablet  TAKE 1 TABLET BY MOUTH EVERY DAY     zolpidem (AMBIEN) 10 MG tablet zolpidem 10 mg tablet  TAKE 1/2 TO 1 TABLET AT BEDTIME FOR SLEEP AS NEEDED     No current facility-administered medications on file prior to visit.    LABS/IMAGING: No results found for this or any previous visit (from the past 48 hour(s)). No results found.  LIPID PANEL:    Component Value Date/Time   CHOL 202 (H) 04/06/2021 0914   TRIG 152 (H) 04/06/2021 0914   HDL 67 04/06/2021 0914   CHOLHDL 3.0 04/06/2021 0914   LDLCALC 109 (H) 04/06/2021 0914    WEIGHTS: Wt Readings from Last 3 Encounters:  07/26/22 173 lb 6.4 oz (78.7 kg)  12/24/20 170 lb 9.6 oz (77.4 kg)  11/30/20 172 lb 9.6 oz (78.3 kg)    VITALS: Ht 5' 1.5" (1.562 m)   Wt 173 lb 6.4 oz (78.7 kg)   BMI 32.23 kg/m   EXAM: General appearance: alert and no distress Neck: no carotid bruit, no JVD, and thyroid not enlarged, symmetric, no tenderness/mass/nodules Lungs: clear to  auscultation bilaterally Heart: regular rate and rhythm and systolic murmur: early systolic 2/6, crescendo at 2nd right intercostal space Abdomen: soft, non-tender; bowel sounds normal; no masses,  no organomegaly Extremities: extremities normal, atraumatic, no cyanosis or edema Pulses: 2+ and symmetric Skin: Skin color, texture, turgor normal. No rashes or lesions Neurologic: Grossly normal Psych: Pleasant  EKG: Normal sinus rhythm at 77-personally reviewed  ASSESSMENT: Palpitations Zero CAC score (11/2020) -  aortic atherosclerosis Aortic valve sclerosis, LVEF 60-65%, grade 1 DD (12/2020) Dizziness Statin intolerance-myalgias/cramps Mixed dyslipidemia, goal LDL <100  PLAN: 1.   Kelly Hopkins has recently been having some palpitations.  She is try to make dietary modifications and lifestyle changes but they persist.  Will place a 2-week monitor to see if we can pick up on the etiology of this.  I suspect either way they are probably low risk but want to rule out possibility of A-fib.  She does have an aortic valve sclerosis murmur that is not necessarily worse than when I listen 2 years ago.  I do not feel she needs a repeat echo.  She is due for repeat lipids which we will draw today.  She reported being fasting.  Medication adjustments as needed.  Plan follow-up with me annually or sooner as necessary.  Chrystie Nose, MD, [facility], FACP  [place]  [facility]  Medical Director of the Advanced Lipid Disorders &   Cardiovascular Risk Reduction Clinic Diplomate of the American Board of Clinical Lipidology Attending Cardiologist  Direct Dial: [phone]  Fax: [phone]  Website:  [email or web address] 07/26/2022, 9:35 AM

## 2022-07-26 NOTE — Patient Instructions (Signed)
Medication Instructions:  Your physician recommends that you continue on your current medications as directed. Please refer to the Current Medication list given to you today.  *If you need a refill on your cardiac medications before your next appointment, please call your pharmacy*   Lab Work: LIPID PANEL today   If you have labs (blood work) drawn today and your tests are completely normal, you will receive your results only by: MyChart Message (if you have MyChart) OR A paper copy in the mail If you have any lab test that is abnormal or we need to change your treatment, we will call you to review the results.   Testing/Procedures: Kelly Hopkins- Long Term Monitor Instructions  Your physician has requested you wear a ZIO patch monitor for 14 days.  This is a single patch monitor. [employer] supplies one patch monitor per enrollment. Additional stickers are not available. Please do not apply patch if you will be having a Nuclear Stress Test,  Echocardiogram, Cardiac CT, MRI, or Chest Xray during the period you would be wearing the  monitor. The patch cannot be worn during these tests. You cannot remove and re-apply the  ZIO XT patch monitor.  Your ZIO patch monitor will be mailed 3 day USPS to your address on file. It may take 3-5 days  to receive your monitor after you have been enrolled.  Once you have received your monitor, please review the enclosed instructions. Your monitor  has already been registered assigning a specific monitor serial # to you.  Billing and Patient Assistance Program Information  We have supplied [employer] with any of your insurance information on file for billing purposes. [employer] offers a sliding scale Patient Assistance Program for patients that do not have  insurance, or whose insurance does not completely cover the cost of the ZIO monitor.  You must apply for the Patient Assistance Program to qualify for this discounted rate.  To apply, please call [employer] at  [phone], select option 4, select option 2, ask to apply for  Patient Assistance Program. Kelly Hopkins will ask your household income, and how many people  are in your household. They will quote your out-of-pocket cost based on that information.  [employer] will also be able to set up a 70-month, interest-free payment plan if needed.  Applying the monitor   Shave hair from upper left chest.  Hold abrader disc by orange tab. Rub abrader in 40 strokes over the upper left chest as  indicated in your monitor instructions.  Clean area with 4 enclosed alcohol pads. Let dry.  Apply patch as indicated in monitor instructions. Patch will be placed under collarbone on left  side of chest with arrow pointing upward.  Rub patch adhesive wings for 2 minutes. Remove white label marked "1". Remove the white  label marked "2". Rub patch adhesive wings for 2 additional minutes.  While looking in a mirror, press and release button in center of patch. A small green light will  flash 3-4 times. This will be your only indicator that the monitor has been turned on.  Do not shower for the first 24 hours. You may shower after the first 24 hours.  Press the button if you feel a symptom. You will hear a small click. Record Date, Time and  Symptom in the Patient Logbook.  When you are ready to remove the patch, follow instructions on the last 2 pages of Patient  Logbook. Stick patch monitor onto the last page of Patient Logbook.  Place Patient Logbook in the blue and white box. Use locking tab on box and tape box closed  securely. The blue and white box has prepaid postage on it. Please place it in the mailbox as  soon as possible. Your physician should have your test results approximately 7 days after the  monitor has been mailed back to [facility].  Call [facility] Customer Care at [phone] if you have questions regarding  your ZIO XT patch monitor. Call them immediately if you see an orange light  blinking on your  monitor.  If your monitor falls off in less than 4 days, contact our Monitor department at [phone].  If your monitor becomes loose or falls off after 4 days call [employer] at [phone] for  suggestions on securing your monitor    Follow-Up: At [facility], you and your health needs are our priority.  As part of our continuing mission to provide you with exceptional heart care, we have created designated Provider Care Teams.  These Care Teams include your primary Cardiologist (physician) and Advanced Practice Providers (APPs -  Physician Assistants and Nurse Practitioners) who all work together to provide you with the care you need, when you need it.  We recommend signing up for the patient portal called "MyChart".  Sign up information is provided on this After Visit Summary.  MyChart is used to connect with patients for Virtual Visits (Telemedicine).  Patients are able to view lab/test results, encounter notes, upcoming appointments, etc.  Non-urgent messages can be sent to your provider as well.   To learn more about what you can do with MyChart, go to [email or web address].    Your next appointment:    12 months with Dr. Rennis Golden

## 2022-07-27 LAB — LIPID PANEL
Chol/HDL Ratio: 2.6 ratio (ref 0.0–4.4)
Cholesterol, Total: 187 mg/dL (ref 100–199)
HDL: 73 mg/dL (ref >39)
LDL Chol Calc (NIH): 95 mg/dL (ref 0–99)
Triglycerides: 110 mg/dL (ref 0–149)
VLDL Cholesterol Cal: 19 mg/dL (ref 5–40)

## 2022-07-28 DIAGNOSIS — R002 Palpitations: Secondary | ICD-10-CM

## 2022-08-19 DIAGNOSIS — R002 Palpitations: Secondary | ICD-10-CM | POA: Diagnosis not present

## 2022-08-29 ENCOUNTER — Encounter (HOSPITAL_BASED_OUTPATIENT_CLINIC_OR_DEPARTMENT_OTHER): Payer: Self-pay | Admitting: Internal Medicine

## 2022-08-29 MED ORDER — METOPROLOL SUCCINATE ER 25 MG PO TB24: 25.0000 mg | ORAL_TABLET | Freq: Every day | ORAL | 3 refills | Status: DC

## 2022-09-13 ENCOUNTER — Ambulatory Visit: Payer: Medicare Other | Admitting: Internal Medicine

## 2022-09-16 ENCOUNTER — Other Ambulatory Visit: Payer: Self-pay | Admitting: Internal Medicine

## 2022-10-10 ENCOUNTER — Encounter (HOSPITAL_BASED_OUTPATIENT_CLINIC_OR_DEPARTMENT_OTHER): Payer: Self-pay | Admitting: Internal Medicine

## 2022-10-17 DIAGNOSIS — H2513 Age-related nuclear cataract, bilateral: Secondary | ICD-10-CM | POA: Diagnosis not present

## 2022-10-17 DIAGNOSIS — H31003 Unspecified chorioretinal scars, bilateral: Secondary | ICD-10-CM | POA: Diagnosis not present

## 2022-10-17 DIAGNOSIS — H35373 Puckering of macula, bilateral: Secondary | ICD-10-CM | POA: Diagnosis not present

## 2022-10-17 DIAGNOSIS — H5213 Myopia, bilateral: Secondary | ICD-10-CM | POA: Diagnosis not present

## 2022-10-24 ENCOUNTER — Encounter (HOSPITAL_BASED_OUTPATIENT_CLINIC_OR_DEPARTMENT_OTHER): Payer: Self-pay | Admitting: Internal Medicine

## 2022-11-16 IMAGING — CT CT CARDIAC CORONARY ARTERY CALCIUM SCORE
3 series · 14 of 20 positions shown, 16 images · non-contrast
Comparison: None.
COMPARISON: None.

Addendum:
EXAM:
OVER-READ INTERPRETATION  CT CHEST

The following report is an over-read performed by radiologist Dr.
Hjrahim Delaila [REDACTED] on 12/14/2020. This
over-read does not include interpretation of cardiac or coronary
anatomy or pathology. The coronary calcium score interpretation by
the cardiologist is attached.
CLINICAL DATA: Risk stratification: 64 Year-old White Female
Coronary Calcium Score
TECHNIQUE: The patient was scanned on a Siemens Force scanner. Axial
non-contrast 3 mm slices were carried out through the heart. The
data set was analyzed on a dedicated work station and scored using
the Agatson method.

[Series 2: cascseq 2.0 sa36 70% (id) · axial · 0.39mm/px · z∈[-234,-144]mm · 4 of 76 slices shown]
[im 16/76  vessel]
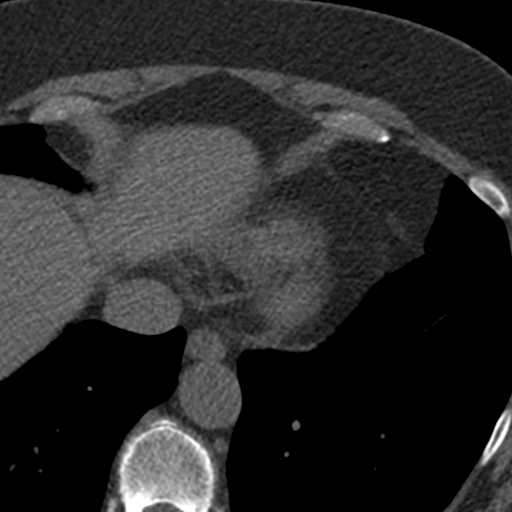
[im 31/76  vessel]
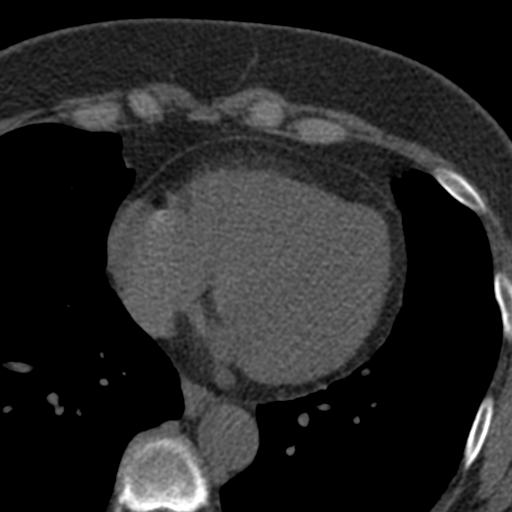
[im 46/76  vessel]
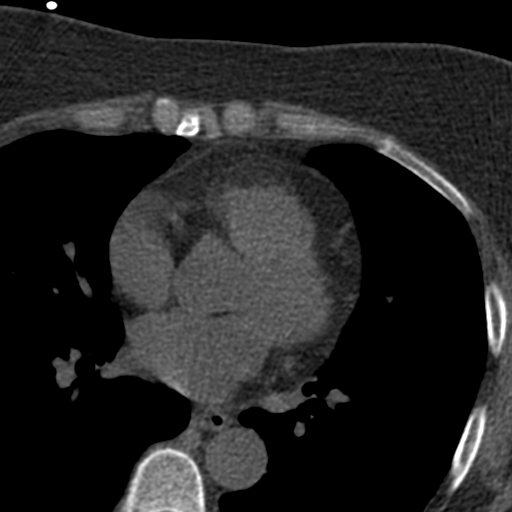
[im 61/76  vessel]
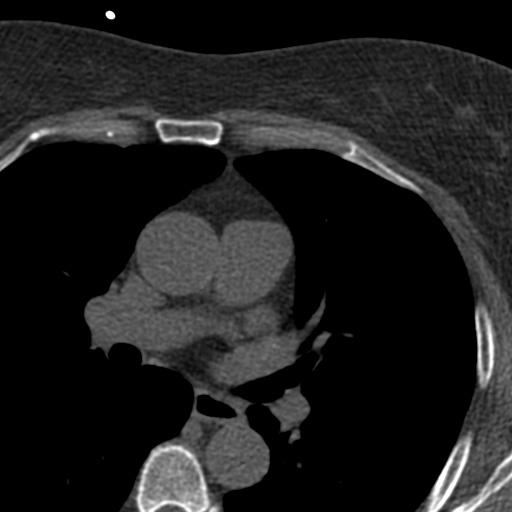

[Series 3: cascseq 2.0 bf37 st · axial · 0.61mm/px · z∈[-240,-140]mm · 5 of 76 slices shown, 7 images]
[im 13/76  vessel]
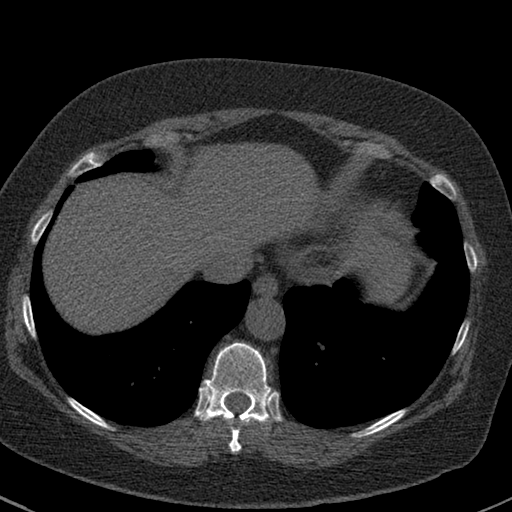
[im 13/76  lung]
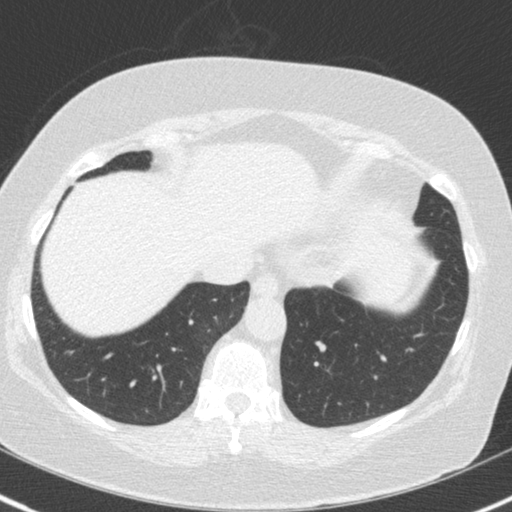
[im 26/76  vessel]
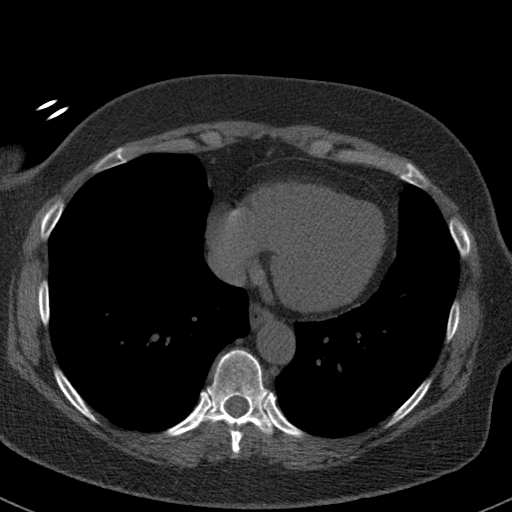
[im 38/76  vessel]
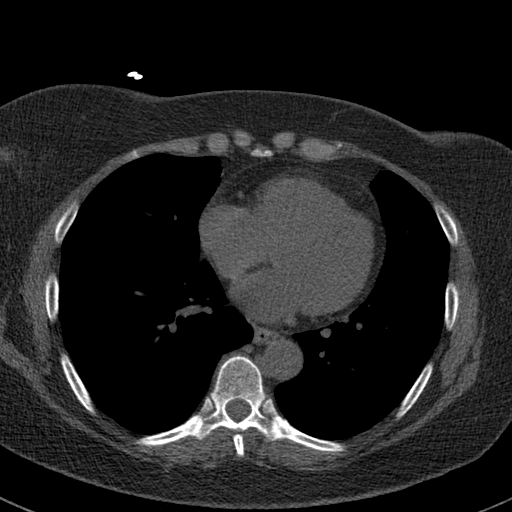
[im 51/76  vessel]
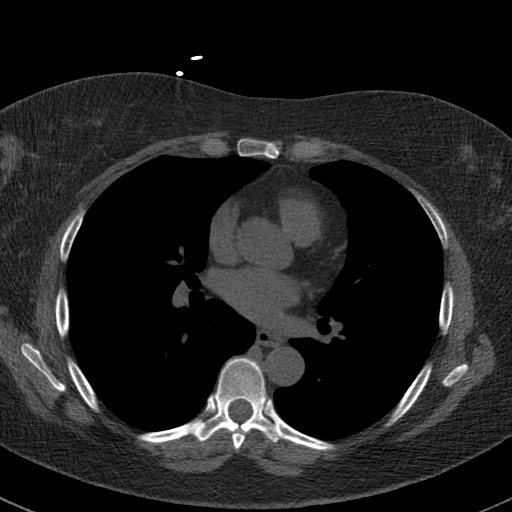
[im 63/76  vessel]
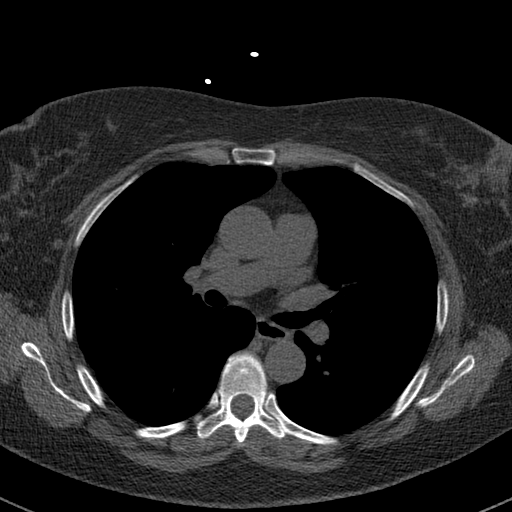
[im 63/76  lung]
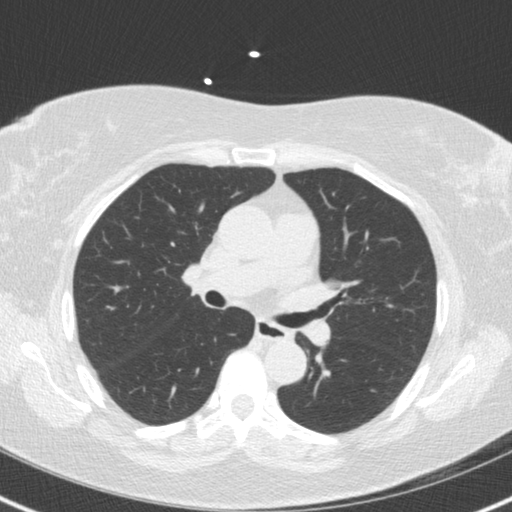

[Series 4: cascseq 2.0 br59 lung · axial · 0.61mm/px · z∈[-240,-140]mm · 5 of 76 slices shown]
[im 13/76  lung]
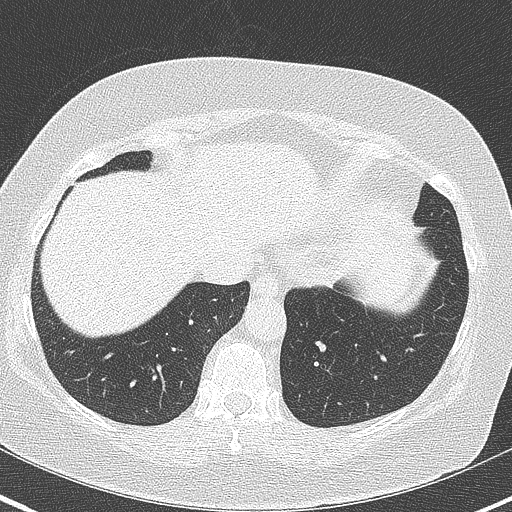
[im 26/76  lung]
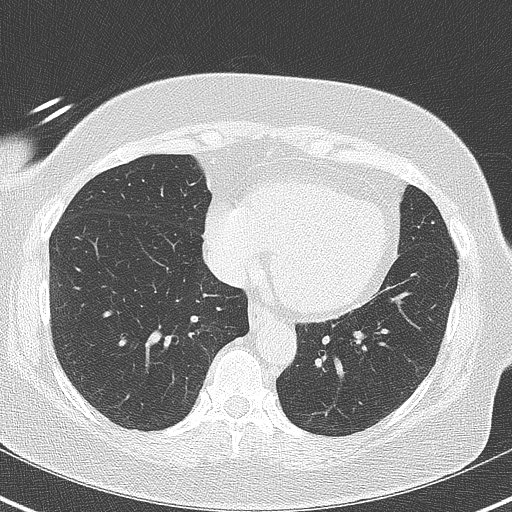
[im 38/76  lung]
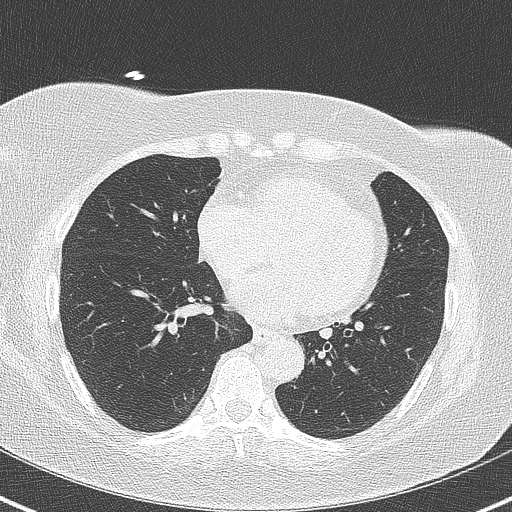
[im 51/76  lung]
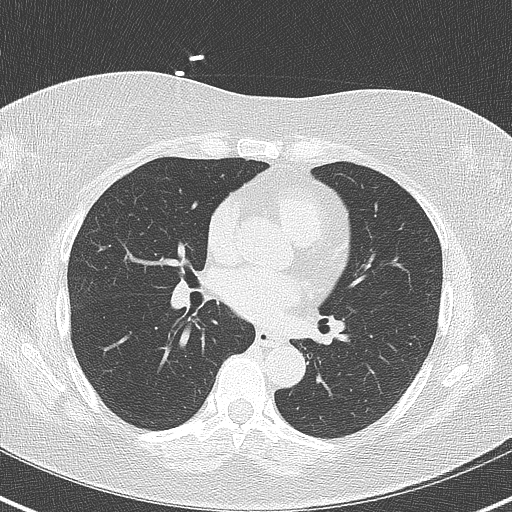
[im 63/76  lung]
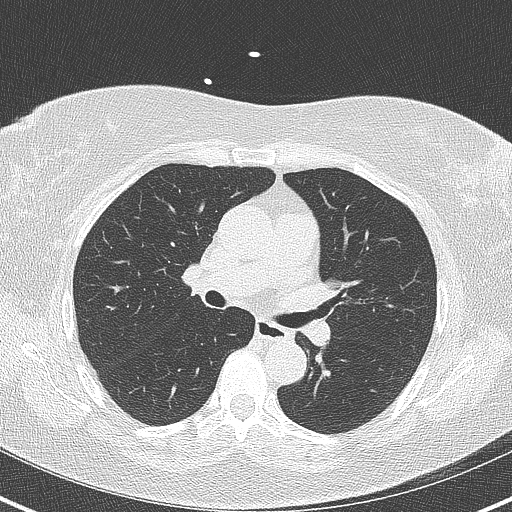

[14 of 20 positions shown; findings below may reference images not displayed]

FINDINGS: Atherosclerotic calcifications in the thoracic aorta. Within the
visualized portions of the thorax there are no suspicious appearing
pulmonary nodules or masses, there is no acute consolidative
airspace disease, no pleural effusions, no pneumothorax and no
lymphadenopathy. Visualized portions of the upper abdomen are
unremarkable. There are no aggressive appearing lytic or blastic
lesions noted in the visualized portions of the skeleton.
IMPRESSION: 1.  Aortic Atherosclerosis (GN00I-VZX.X).
FINDINGS: Non-cardiac: See separate report from [REDACTED].

Ascending Aorta: Normal caliber.  Aortic atherosclerosis is noted.

Pericardium: Normal.

Coronary arteries: Normal origins.

Coronary Calcium Score:

Left main: 0

Left anterior descending artery: 0

Left circumflex artery: 0

Right coronary artery: 0

Total: 0

Percentile: 1st for age, sex, and race matched control.
IMPRESSION: 1. Coronary calcium score of 0. This was 1st percentile for age,
gender, and race matched controls.

2. Aortic atherosclerosis.

RECOMMENDATIONS:

The proposed cut-off value of 1,651 ZRIM yielded a 93 % sensitivity
and 75 % specificity in grading AS severity in patients with
classical low-flow, low-gradient AS. Proposed different cut-off
values to define severe AS for men and women as 2,065 ZRIM and 1,274
ZRIM, respectively. The joint European and American recommendations
for the assessment of AS consider the aortic valve calcium score as
a continuum - a very high calcium score suggests severe AS and a low
calcium score suggests severe AS is unlikely.

Gelin, [place], et al. 3159 ESC/EACTS Guidelines for
the management of valvular heart disease. Eur Heart J
3159;[DATE].



If CAC = 0, it is reasonable to withhold statin therapy and reassess
in 5 to 10 years, as long as higher risk conditions are absent
(diabetes mellitus, family history of premature CHD in first degree
relatives (males <55 years; females <65 years), cigarette smoking,
LDL >=190 mg/dL or other independent risk factors).

If CAC is 1 to 99, it is reasonable to initiate statin therapy for
patients >=55 years of age.

If CAC is >=100 or >=75th percentile, it is reasonable to initiate
statin therapy at any age.

Cardiology referral should be considered for patients with CAC
scores =400 or >=75th percentile.

*8885 AHA/ACC/AACVPR/AAPA/ABC/FERIENHAUS/DONGHOE/PARKINSON/Rosa [place]/FRANTZ/LAMURIAS/ARTEMIO
Guideline on the Management of Blood Cholesterol: A Report of the
American College of Cardiology/American Heart Association Task Force
on Clinical Practice Guidelines. J Am Coll Cardiol.
3967;73(24):5798-5206.

*** End of Addendum ***
EXAM:
OVER-READ INTERPRETATION  CT CHEST

The following report is an over-read performed by radiologist Dr.
Hjrahim Delaila [REDACTED] on 12/14/2020. This
over-read does not include interpretation of cardiac or coronary
anatomy or pathology. The coronary calcium score interpretation by
the cardiologist is attached.
FINDINGS: Atherosclerotic calcifications in the thoracic aorta. Within the
visualized portions of the thorax there are no suspicious appearing
pulmonary nodules or masses, there is no acute consolidative
airspace disease, no pleural effusions, no pneumothorax and no
lymphadenopathy. Visualized portions of the upper abdomen are
unremarkable. There are no aggressive appearing lytic or blastic
lesions noted in the visualized portions of the skeleton.
IMPRESSION: 1.  Aortic Atherosclerosis (GN00I-VZX.X).

## 2022-12-20 DIAGNOSIS — M25551 Pain in right hip: Secondary | ICD-10-CM | POA: Diagnosis not present

## 2022-12-20 DIAGNOSIS — M25512 Pain in left shoulder: Secondary | ICD-10-CM | POA: Diagnosis not present

## 2022-12-20 DIAGNOSIS — M19012 Primary osteoarthritis, left shoulder: Secondary | ICD-10-CM | POA: Diagnosis not present

## 2022-12-20 DIAGNOSIS — M533 Sacrococcygeal disorders, not elsewhere classified: Secondary | ICD-10-CM | POA: Diagnosis not present

## 2023-06-08 DIAGNOSIS — Z1231 Encounter for screening mammogram for malignant neoplasm of breast: Secondary | ICD-10-CM | POA: Diagnosis not present

## 2023-06-29 DIAGNOSIS — R92321 Mammographic fibroglandular density, right breast: Secondary | ICD-10-CM | POA: Diagnosis not present

## 2023-06-29 DIAGNOSIS — N6314 Unspecified lump in the right breast, lower inner quadrant: Secondary | ICD-10-CM | POA: Diagnosis not present

## 2023-06-29 DIAGNOSIS — N6001 Solitary cyst of right breast: Secondary | ICD-10-CM | POA: Diagnosis not present

## 2023-06-29 DIAGNOSIS — N6002 Solitary cyst of left breast: Secondary | ICD-10-CM | POA: Diagnosis not present

## 2023-07-10 DIAGNOSIS — Z0000 Encounter for general adult medical examination without abnormal findings: Secondary | ICD-10-CM | POA: Diagnosis not present

## 2023-07-11 LAB — LAB REPORT - SCANNED
A1c: 5.9
EGFR: 84

## 2023-08-14 DIAGNOSIS — N390 Urinary tract infection, site not specified: Secondary | ICD-10-CM | POA: Diagnosis not present

## 2023-08-20 ENCOUNTER — Other Ambulatory Visit: Payer: Self-pay | Admitting: Internal Medicine

## 2023-09-06 DIAGNOSIS — M8588 Other specified disorders of bone density and structure, other site: Secondary | ICD-10-CM | POA: Diagnosis not present

## 2023-09-06 DIAGNOSIS — Z8262 Family history of osteoporosis: Secondary | ICD-10-CM | POA: Diagnosis not present

## 2023-09-13 ENCOUNTER — Other Ambulatory Visit (HOSPITAL_BASED_OUTPATIENT_CLINIC_OR_DEPARTMENT_OTHER): Payer: Self-pay | Admitting: Internal Medicine

## 2023-10-08 ENCOUNTER — Other Ambulatory Visit (HOSPITAL_BASED_OUTPATIENT_CLINIC_OR_DEPARTMENT_OTHER): Payer: Self-pay | Admitting: Internal Medicine

## 2023-10-23 DIAGNOSIS — H35373 Puckering of macula, bilateral: Secondary | ICD-10-CM | POA: Diagnosis not present

## 2023-10-23 DIAGNOSIS — H25813 Combined forms of age-related cataract, bilateral: Secondary | ICD-10-CM | POA: Diagnosis not present

## 2023-10-23 DIAGNOSIS — H5213 Myopia, bilateral: Secondary | ICD-10-CM | POA: Diagnosis not present

## 2023-12-26 ENCOUNTER — Ambulatory Visit: Attending: Internal Medicine | Admitting: Internal Medicine

## 2023-12-26 VITALS — BP 134/84 | HR 68 | Ht 61.0 in | Wt 182.0 lb

## 2023-12-26 DIAGNOSIS — I10 Essential (primary) hypertension: Secondary | ICD-10-CM

## 2023-12-26 DIAGNOSIS — I358 Other nonrheumatic aortic valve disorders: Secondary | ICD-10-CM | POA: Diagnosis not present

## 2023-12-26 DIAGNOSIS — I471 Supraventricular tachycardia, unspecified: Secondary | ICD-10-CM

## 2023-12-26 DIAGNOSIS — G250 Essential tremor: Secondary | ICD-10-CM | POA: Diagnosis not present

## 2023-12-26 DIAGNOSIS — I700 Atherosclerosis of aorta: Secondary | ICD-10-CM | POA: Diagnosis not present

## 2023-12-26 DIAGNOSIS — E785 Hyperlipidemia, unspecified: Secondary | ICD-10-CM | POA: Diagnosis not present

## 2023-12-26 DIAGNOSIS — E7800 Pure hypercholesterolemia, unspecified: Secondary | ICD-10-CM | POA: Diagnosis not present

## 2023-12-26 MED ORDER — ATENOLOL 50 MG PO TABS: 50.0000 mg | ORAL_TABLET | Freq: Every day | ORAL | 3 refills | Status: AC

## 2023-12-26 NOTE — Progress Notes (Signed)
 OFFICE NOTE  Chief Complaint:  Follow-up  Primary Care Physician: Cleotilde Planas, MD  HPI:  Kelly Hopkins is a 68 y.o. female who is being seen today for the evaluation of dizziness, statin intolerance at the request of Cleotilde Planas, MD. This is a pleasant 68 year old female kindly referred by Dr. Rox for evaluation and management of dizziness and statin intolerance.  She has been followed by neurology for dizziness but recently has had some issues with imbalance, hearing her heartbeat in her neck, and suffering from some leg cramps.  She has been on and off of statins, in the past has tried Lipitor, Zocor and more recently placed on Crestor  for elevated cholesterol.  Her on treatment cholesterol in May was total 176, triglycerides 112, HDL 71 and LDL 85.  Unfortunately she still feels like she is having cramping on this medication which is rosuvastatin  20 mg daily.  Off treatment I would expect her cholesterol to be about 50% higher.  She also takes low-dose aspirin but has few other cardiovascular risk factors including no history of hypertension, diabetes or other risk factors other than age.  She does report her father had had an MI that he died from in his 10s.  She eats a varied diet but has had recent weight gain.  12/24/2020  [name] is seen today in follow-up.  Overall she is doing fairly well.  She has been experiencing some left leg pain particularly tightness and discomfort in her left calf particularly when walking.  She felt like this was a statin side effect however after taking a statin holiday the symptoms did not necessarily improve.  I advised her that is not typical to see a statin side effect focal to one muscle group.  She does have some varicose veins and it could be related to that.  She is also concerned about the possibility of DVT.  She has been on low-dose aspirin however which would lower that risk.  She had a calcium  score which is 0 although there was some aortic  atherosclerosis.  I would target her LDL to less than 100.  She had previously been on 20 of rosuvastatin  and I would recommend we decrease that to 10 mg daily to try to strike a balance between risk and benefit.  Echo showed some aortic sclerosis with normal systolic function and mild diastolic dysfunction.  Nothing of super concern.  07/26/2022  [name] returns today for follow-up.  Recently she has been having some palpitations.  She has noted it mostly at rest.  They are paroxysmal.  Nothing seems to provoke it or improve it.  She is also noted some elevated heart rate and also some slower than typical heart rates (in the 60s) on her Fitbit.  She denies any chest pain.  She had a negative calcium  score in the past and an echo which showed some mild diastolic dysfunction, normal LVEF and aortic valve sclerosis.  Her cholesterol was assessed last year when LDL was at 99 which was at target.  She is tolerating 10 mg rosuvastatin  daily without issues.  12/26/2023  [name] returns today for follow-up.  She reports she feels that she may be having more frequent tremors.  She does have a Fitbit but it is not indicated any significant tachycardia.  Previously she had heart rates up into the 170s.  She was started on metoprolol  for that.  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  She saw  neurology a number of years ago but her symptoms have been worsening.  Also her blood pressure has been elevated recently.  Today was 134/84 however home readings are about 150.  She had no coronary calcium  in the past but was noted to have aortic atherosclerosis and I would suggest to target LDL less than 70.  Recent labs earlier this year showed total cholesterol 173, triglycerides 138, HDL 66 and LDL 84.  PMHx:  Past Medical History:  Diagnosis Date   Acid reflux    Anxiety    Anxiety, mild    Chronic insomnia    Emotional depression    Female stress incontinence    Fox-Fordyce disease     Hypercholesteremia    Insomnia    Menopausal depression    Plantar fasciitis, left    Scoliosis deformity of spine    Synovial cyst of lumbar facet joint    L5-S1   Tuberculosis disease, miliary 2001   Took meds x 9 mos     FAMHx:  Family History  Problem Relation Age of Onset   Asthma Mother    COPD Mother        Deceased   Diabetes Father    Heart attack Father 44       Deceased   Breast cancer Paternal Aunt     SOCHx:   reports that she quit smoking about 17 years ago. Her smoking use included cigarettes. She started smoking about 53 years ago. She has a 36 pack-year smoking history. She has never used smokeless tobacco. She reports current alcohol use of about 2.0 standard drinks of alcohol per week. She reports that she does not use drugs.  ALLERGIES:  Allergies  Allergen Reactions   Atorvastatin     Myalgias, Cramps   Rosuvastatin      Muscle cramps   Zocor [Simvastatin]     Myalgias, Cramps    ROS: Pertinent items noted in HPI and remainder of comprehensive ROS otherwise negative.  HOME MEDS: Current Outpatient Medications on File Prior to Visit  Medication Sig Dispense Refill   ALPRAZolam (XANAX) 0.25 MG tablet alprazolam 0.25 mg tablet  ONE TABLET AS NEEDED FOR ANXIETY   NOT TO EXCEED 1 /24 HOURS     ibuprofen (IBU-200) 200 MG tablet Take 200 mg by mouth as needed.     metoprolol  succinate (TOPROL -XL) 25 MG 24 hr tablet TAKE 1 TABLET (25 MG TOTAL) BY MOUTH DAILY. PATIENT MUST MAKE ANNUAL APPOINTMENT FOR FURTHER REFILLS 90 tablet 0   pantoprazole (PROTONIX) 40 MG tablet Take 40 mg by mouth daily.     rosuvastatin  (CRESTOR ) 10 MG tablet TAKE 1 TABLET BY MOUTH DAILY. PATIENT MUST MAKE ANNUAL APPOINTMENT FOR FURTHER REFILLS. 90 tablet 0   sertraline (ZOLOFT) 100 MG tablet sertraline 100 mg tablet  TAKE 1 TABLET BY MOUTH EVERY DAY     zolpidem (AMBIEN) 10 MG tablet zolpidem 10 mg tablet  TAKE 1/2 TO 1 TABLET AT BEDTIME FOR SLEEP AS NEEDED     Acetaminophen  (TYLENOL ARTHRITIS EXT RELIEF PO)  (Patient not taking: Reported on 12/26/2023)     influenza vac split quadrivalent PF (AFLURIA QUADRIVALENT) 0.5 ML injection Afluria Qd 2020-21 (36 mos up)(PF)60 mcg (15 mcg x4)/0.5 mL IM syringe  PHARMACY ADMINISTERED     No current facility-administered medications on file prior to visit.    LABS/IMAGING: No results found for this or any previous visit (from the past 48 hours). No results found.  LIPID PANEL:  Component Value Date/Time   CHOL 187 07/26/2022 1012   TRIG 110 07/26/2022 1012   HDL 73 07/26/2022 1012   CHOLHDL 2.6 07/26/2022 1012   LDLCALC 95 07/26/2022 1012    WEIGHTS: Wt Readings from Last 3 Encounters:  12/26/23 182 lb (82.6 kg)  07/26/22 173 lb 6.4 oz (78.7 kg)  12/24/20 170 lb 9.6 oz (77.4 kg)    VITALS: BP 134/84 (BP Location: Right Arm, Patient Position: Sitting, Cuff Size: Large)   Pulse 68   Ht 5' 1 (1.549 m)   Wt 182 lb (82.6 kg)   SpO2 98%   BMI 34.39 kg/m   EXAM: General appearance: alert and no distress Neck: no carotid bruit, no JVD, and thyroid  not enlarged, symmetric, no tenderness/mass/nodules Lungs: clear to auscultation bilaterally Heart: regular rate and rhythm and systolic murmur: early systolic 2/6, crescendo at 2nd right intercostal space Abdomen: soft, non-tender; bowel sounds normal; no masses,  no organomegaly Extremities: extremities normal, atraumatic, no cyanosis or edema Pulses: 2+ and symmetric Skin: Skin color, texture, turgor normal. No rashes or lesions Neurologic: Mental status: Alert, oriented, thought content appropriate, axial tremor with abnormal phonation, no significant rest hand tremor Psych: Pleasant  EKG: EKG Interpretation Date/Time:  Tuesday December 26 2023 09:01:50 EDT Ventricular Rate:  67 PR Interval:  170 QRS Duration:  80 QT Interval:  436 QTC Calculation: 460 R Axis:   38  Text Interpretation: Normal sinus rhythm Normal ECG When compared with ECG of  15-Jan-2008 09:00, No significant change since last tracing Confirmed by Mona Kent [phone]) on 12/26/2023 9:14:54 AM    ASSESSMENT: PSVT Zero CAC score (11/2020) - aortic atherosclerosis Aortic valve sclerosis, 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   Ms. Games has recently been having worsening tremor symptoms.  Particularly with the axial head bobbing.  She has also noted some hoarseness and changes to her voice which is suspicious for essential tremor.  I believe she said her mom had a tremor.  She had previously been seen by neurology and since it is worsening she would like to be seen again.  Will refer back to Dr. Geralene.  I think she has had good control of her SVT on metoprolol  however this is less effective for tremor.  Also she is having issues with hypertension.  The metoprolol  is minimally beneficial for that.  Propranolol however is also not a very good blood pressure medicine in my opinion.  After weighing some options I think perhaps atenolol  might be a good choice to help both lower blood pressure and give her some benefit with regards to SVT.  Will start 50 mg daily.  Will also repeat a lipid profile today.  If she is not at target I would consider adding ezetimibe since she is on the max tolerated statin dose currently.  Plan follow-up annually or sooner as necessary.  Kent KYM Mona, MD, [facility], FNLA, FACP  [place]  [facility]  Medical Director of the [facility]  [facility] Reduction [facility] Diplomate of the American Board of Clinical Lipidology Attending Cardiologist  Direct Dial: [phone]  Fax: [phone]  Website:  [email or web address] Kent BROCKS Lester Crickenberger 12/26/2023, 9:14 AM

## 2023-12-26 NOTE — Patient Instructions (Addendum)
 Medication Instructions:  Your physician has recommended you make the following change in your medication:   ** Stop Metoprolol   ** Begin Atenolol  50mg  - 1 tablet by mouth daily.  *If you need a refill on your cardiac medications before your next appointment, please call your pharmacy*  Lab Work: NMR and Lpa today If you have labs (blood work) drawn today and your tests are completely normal, you will receive your results only by: MyChart Message (if you have MyChart) OR A paper copy in the mail If you have any lab test that is abnormal or we need to change your treatment, we will call you to review the results.  Testing/Procedures: None ordered.   Follow-Up: At [facility], you and your health needs are our priority.  As part of our continuing mission to provide you with exceptional heart care, our providers are all part of one team.  This team includes your primary Cardiologist (physician) and Advanced Practice Providers or APPs (Physician Assistants and Nurse Practitioners) who all work together to provide you with the care you need, when you need it.  Your next appointment:   12 months with Dr Mona or his APP    We recommend signing up for the patient portal called MyChart.  Sign up information is provided on this After Visit Summary.  MyChart is used to connect with patients for Virtual Visits (Telemedicine).  Patients are able to view lab/test results, encounter notes, upcoming appointments, etc.  Non-urgent messages can be sent to your provider as well.   To learn more about what you can do with MyChart, go to [email or web address].   Other Instructions Referral to Neurology  Please send BP readings through MyChart to Dr Mona

## 2023-12-27 LAB — LIPOPROTEIN A (LPA): Lipoprotein (a): 50.6 nmol/L (ref <75.0)

## 2023-12-27 LAB — NMR, LIPOPROFILE
Cholesterol, Total: 166 mg/dL (ref 100–199)
HDL Particle Number: 43.2 umol/L (ref >=30.5)
HDL-C: 69 mg/dL (ref >39)
LDL Particle Number: 1006 nmol/L — ABNORMAL HIGH (ref <1000)
LDL Size: 21.2 nm (ref >20.5)
LDL-C (NIH Calc): 78 mg/dL (ref 0–99)
LP-IR Score: 39 (ref <=45)
Small LDL Particle Number: 485 nmol/L (ref <=527)
Triglycerides: 106 mg/dL (ref 0–149)

## 2024-01-01 ENCOUNTER — Encounter: Payer: Self-pay | Admitting: Internal Medicine

## 2024-01-02 ENCOUNTER — Ambulatory Visit: Payer: Self-pay | Admitting: Internal Medicine

## 2024-01-02 ENCOUNTER — Other Ambulatory Visit (HOSPITAL_BASED_OUTPATIENT_CLINIC_OR_DEPARTMENT_OTHER): Payer: Self-pay | Admitting: Internal Medicine

## 2024-02-06 DIAGNOSIS — M25512 Pain in left shoulder: Secondary | ICD-10-CM | POA: Diagnosis not present

## 2024-05-06 ENCOUNTER — Telehealth: Payer: Self-pay | Admitting: Neurology

## 2024-05-06 NOTE — Telephone Encounter (Signed)
 Mychart msg r/s provider will be out

## 2024-05-22 ENCOUNTER — Encounter: Payer: Self-pay | Admitting: Neurology

## 2024-05-22 ENCOUNTER — Ambulatory Visit: Admitting: Neurology

## 2024-05-22 VITALS — BP 136/89 | HR 65 | Ht 61.0 in | Wt 179.4 lb

## 2024-05-22 DIAGNOSIS — R0683 Snoring: Secondary | ICD-10-CM | POA: Diagnosis not present

## 2024-05-22 DIAGNOSIS — G250 Essential tremor: Secondary | ICD-10-CM | POA: Diagnosis not present

## 2024-05-22 DIAGNOSIS — G479 Sleep disorder, unspecified: Secondary | ICD-10-CM | POA: Diagnosis not present

## 2024-05-22 DIAGNOSIS — R498 Other voice and resonance disorders: Secondary | ICD-10-CM

## 2024-05-22 DIAGNOSIS — Z9189 Other specified personal risk factors, not elsewhere classified: Secondary | ICD-10-CM

## 2024-05-22 MED ORDER — PRIMIDONE 50 MG PO TABS: 50.0000 mg | ORAL_TABLET | Freq: Every day | ORAL | 3 refills | Status: AC

## 2024-05-22 NOTE — Patient Instructions (Addendum)
 You have a head tremor and may have what we call essential tremor.  I do not see any signs or symptoms of parkinson's like disease or what we call parkinsonism.  For your tremor, we will start you on a medication primidone .   Mysoline  (primidone ) 50 mg strength: Take 1/2 pill each bedtime for 2 weeks, then 1 pill each bedtime for 2 weeks, then 1 1/2 pills each bedtime for 2 weeks, then 2 pills each bedtime thereafter. Common side effects reported are: Sleepiness, drowsiness, balance problems, confusion, and GI related symptoms.  Please remember, that any kind of tremor may be exacerbated by anxiety, anger, nervousness, excitement, dehydration, sleep deprivation, thyroid  dysfunction, by caffeine, and low blood sugar values or blood sugar fluctuations. Some medications can exacerbate tremors, this includes certain asthma or COPD medications and certain antidepressants.   Try to take the primidone  about 1 hour before your Ambien.   Based on your symptoms and your exam I believe we should look for an underlying organic sleep disorder, such as obstructive sleep apnea (OSA) with a sleep study.  If you have more than mild OSA, I want you to consider treatment with CPAP. Please remember, the risks and ramifications of moderate to severe obstructive sleep apnea or OSA are: Cardiovascular disease, including congestive heart failure, stroke, difficult to control hypertension, arrhythmias, and even type 2 diabetes has been linked to untreated OSA. Sleep apnea causes disruption of sleep and sleep deprivation in most cases, which, in turn, can cause recurrent headaches, problems with memory, mood, concentration, focus, and vigilance. Most people with untreated sleep apnea report excessive daytime sleepiness, which can affect their ability to drive. Please do not drive if you feel sleepy.   We will call you after your sleep study to advise about the results (most likely, you will hear my nurse or the sleep lab  manager).    Our sleep lab administrative assistant, will call you to schedule your sleep study. If you don't hear back from her by about 2 weeks from now, please feel free to call her at [phone]. This is her direct line and please leave a message with your phone number to call back if you get the voicemail box. She will call back as soon as possible.

## 2024-05-22 NOTE — Progress Notes (Signed)
 SABRA
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
## 2024-05-23 ENCOUNTER — Ambulatory Visit: Admitting: Neurology

## 2024-05-29 ENCOUNTER — Ambulatory Visit: Admitting: Neurology

## 2024-06-18 ENCOUNTER — Encounter
